# Patient Record
Sex: Female | Born: 1940
Health system: Southern US, Community
[De-identification: ages and names within clinical notes are randomized; demographics above are authoritative.]

## PROBLEM LIST (undated history)

## (undated) DIAGNOSIS — E785 Hyperlipidemia, unspecified: Secondary | ICD-10-CM

## (undated) DIAGNOSIS — I252 Old myocardial infarction: Secondary | ICD-10-CM

## (undated) DIAGNOSIS — I440 Atrioventricular block, first degree: Secondary | ICD-10-CM

## (undated) DIAGNOSIS — I10 Essential (primary) hypertension: Secondary | ICD-10-CM

## (undated) DIAGNOSIS — E119 Type 2 diabetes mellitus without complications: Secondary | ICD-10-CM

## (undated) DIAGNOSIS — Z90710 Acquired absence of both cervix and uterus: Secondary | ICD-10-CM

## (undated) DIAGNOSIS — Z7901 Long term (current) use of anticoagulants: Secondary | ICD-10-CM

## (undated) DIAGNOSIS — I2510 Atherosclerotic heart disease of native coronary artery without angina pectoris: Secondary | ICD-10-CM

## (undated) DIAGNOSIS — I517 Cardiomegaly: Secondary | ICD-10-CM

## (undated) DIAGNOSIS — D649 Anemia, unspecified: Secondary | ICD-10-CM

## (undated) DIAGNOSIS — Z79899 Other long term (current) drug therapy: Secondary | ICD-10-CM

## (undated) DIAGNOSIS — R51 Headache: Secondary | ICD-10-CM

## (undated) DIAGNOSIS — I4891 Unspecified atrial fibrillation: Secondary | ICD-10-CM

## (undated) DIAGNOSIS — E039 Hypothyroidism, unspecified: Secondary | ICD-10-CM

## (undated) DIAGNOSIS — I5181 Takotsubo syndrome: Secondary | ICD-10-CM

## (undated) DIAGNOSIS — I059 Rheumatic mitral valve disease, unspecified: Secondary | ICD-10-CM

## (undated) DIAGNOSIS — W19XXXA Unspecified fall, initial encounter: Secondary | ICD-10-CM

## (undated) DIAGNOSIS — R519 Headache, unspecified: Secondary | ICD-10-CM

## (undated) DIAGNOSIS — I214 Non-ST elevation (NSTEMI) myocardial infarction: Secondary | ICD-10-CM

## (undated) DIAGNOSIS — I079 Rheumatic tricuspid valve disease, unspecified: Secondary | ICD-10-CM

## (undated) HISTORY — DX: Hypothyroidism, unspecified: E03.9

## (undated) HISTORY — DX: Headache: R51

## (undated) HISTORY — PX: INCONTINENCE SURGERY: SHX676

## (undated) HISTORY — DX: Old myocardial infarction: I25.2

## (undated) HISTORY — DX: Other long term (current) drug therapy: Z79.899

## (undated) HISTORY — DX: Headache, unspecified: R51.9

## (undated) HISTORY — DX: Type 2 diabetes mellitus without complications: E11.9

## (undated) HISTORY — DX: Rheumatic mitral valve disease, unspecified: I05.9

## (undated) HISTORY — PX: TOTAL ABDOMINAL HYSTERECTOMY: SHX209

## (undated) HISTORY — DX: Non-ST elevation (NSTEMI) myocardial infarction: I21.4

## (undated) HISTORY — PX: NO PAST SURGERIES: SHX2092

## (undated) HISTORY — DX: Unspecified atrial fibrillation: I48.91

## (undated) HISTORY — DX: Unspecified fall, initial encounter: W19.XXXA

## (undated) HISTORY — DX: Cardiomegaly: I51.7

## (undated) HISTORY — DX: Essential (primary) hypertension: I10

## (undated) HISTORY — DX: Rheumatic tricuspid valve disease, unspecified: I07.9

## (undated) HISTORY — DX: Acquired absence of both cervix and uterus: Z90.710

## (undated) HISTORY — PX: APPENDECTOMY: SHX54

## (undated) HISTORY — DX: Atherosclerotic heart disease of native coronary artery without angina pectoris: I25.10

## (undated) HISTORY — DX: Anemia, unspecified: D64.9

## (undated) HISTORY — DX: Long term (current) use of anticoagulants: Z79.01

## (undated) HISTORY — DX: Hyperlipidemia, unspecified: E78.5

## (undated) HISTORY — DX: Atrioventricular block, first degree: I44.0

## (undated) HISTORY — DX: Takotsubo syndrome: I51.81

---

## 2005-03-28 HISTORY — PX: COLONOSCOPY: SHX174

## 2014-08-09 DIAGNOSIS — Z13820 Encounter for screening for osteoporosis: Secondary | ICD-10-CM | POA: Diagnosis not present

## 2014-08-09 DIAGNOSIS — M8589 Other specified disorders of bone density and structure, multiple sites: Secondary | ICD-10-CM | POA: Diagnosis not present

## 2014-08-09 DIAGNOSIS — M8580 Other specified disorders of bone density and structure, unspecified site: Secondary | ICD-10-CM | POA: Diagnosis not present

## 2014-12-10 ENCOUNTER — Encounter: Payer: Self-pay | Admitting: *Deleted

## 2014-12-14 NOTE — Progress Notes (Signed)
Patient ID: Melissa Jackson, female   DOB: 1940-08-13, 74 y.o.   MRN: [id number]     Cardiology Office Note   Date:  12/14/2014   ID:  Melissa Jackson, DOB Oct 12, 1940, MRN [id number]  PCP:  No primary care provider on file.  Cardiologist:   Jenkins Rouge, MD   No chief complaint on file.     History of Present Illness: Melissa Jackson How is a 74 y.o. female who presents for evaluation of CAD.  Has seen Dr [name] in [facility] before I take care of her husband  Reviewed over 58 pages of notes from [name] primary in [place] and Dr [name] [facility] previous cardiologist.  She is intolerant to statins Tried on multiple and all caused muscle wasting and pain.  9/232014 had classic angina and had complex intervention with Xience stent of LAD and Ramus and POB of D1  EF preserved 55%  No recurrent angina.  Carries nitro with her.  No stress testing since  Also has PAF.  With SSS.  Amiodarone has been effective.  Primary checks TSH and liver have been normal.  CXR last year 9315 reviewed normal but no PFTls Noted to be bradycardic on current dose of beta blocker.    Tries to watch diet with DM    LDL runs 170-185 on red yeast rice HDL in 50's   Past Medical History  Diagnosis Date  . Acute myocardial infarction, subendocardial infarction, initial episode of care   . Anemia   . Anticoagulated     ON PRADAXA  . Atrial fibrillation   . Coronary artery disease   . Diabetes mellitus with no complication   . Disease of tricuspid valve     MILD  . Hypertension, essential   . Fall   . First degree atrioventricular block   . Head ache   . High risk medication use   . Hyperlipidemia     SEVERE  . Hypothyroidism   . Left atrial enlargement     MILD  . Mitral valvular disorder     MILD-MODERATE  . Old myocardial infarction     JAN 2008  . Takotsubo syndrome     No past surgical history on file.   Current Outpatient Prescriptions  Medication Sig Dispense Refill  . amiodarone (PACERONE) 400 MG tablet  Take 200 mg by mouth daily.    Marland Kitchen aspirin 81 MG tablet Take 81 mg by mouth daily.    . clopidogrel (PLAVIX) 75 MG tablet Take 75 mg by mouth daily.    Marland Kitchen glyBURIDE-metformin (GLUCOVANCE) 5-500 MG per tablet Take 2 tablets by mouth 2 (two) times daily with a meal.    . levothyroxine (SYNTHROID, LEVOTHROID) 75 MCG tablet Take 75 mcg by mouth daily before breakfast.    . metoprolol (LOPRESSOR) 50 MG tablet Take 50 mg by mouth 2 (two) times daily.    . Multiple Vitamins-Minerals (MULTIVITAMIN WITH MINERALS) tablet Take 1 tablet by mouth daily.    . nitroGLYCERIN (NITROSTAT) 0.4 MG SL tablet Place 0.4 mg under the tongue every 5 (five) minutes as needed for chest pain.    . Omega 3 1000 MG CAPS Take 2 capsules by mouth 2 (two) times daily.    . Red Yeast Rice 600 MG CAPS Take by mouth.     No current facility-administered medications for this visit.    Allergies:   Lisinopril; Codeine; Morphine and related; and Statins    Social History:  The patient  reports that she has never  smoked. She does not have any smokeless tobacco history on file.   Family History:  The patient's family history includes Alzheimer's disease in her mother; Diabetes Mellitus I in her mother; Heart attack in her father.    ROS:  Please see the history of present illness.   Otherwise, review of systems are positive for none.   All other systems are reviewed and negative.    PHYSICAL EXAM: VS:  There were no vitals taken for this visit. , BMI There is no height or weight on file to calculate BMI. Affect appropriate Healthy:  appears stated age 48: normal Neck supple with no adenopathy JVP normal no bruits no thyromegaly Lungs clear with no wheezing and good diaphragmatic motion Heart:  S1/S2 no murmur, no rub, gallop or click PMI normal Abdomen: benighn, BS positve, no tenderness, no AAA no bruit.  No HSM or HJR Distal pulses intact with no bruits No edema Neuro non-focal Skin warm and dry No muscular  weakness    EKG:  NSR rate 51 read as junctional but not inferior ST changes no change from [place] ECG 9/15    Recent Labs: No results found for requested labs within last 365 days.    Lipid Panel No results found for: CHOL, TRIG, HDL, CHOLHDL, VLDL, LDLCALC, LDLDIRECT    Wt Readings from Last 3 Encounters:  No data found for Wt      Other studies Reviewed: Additional studies/ records that were reviewed today include: Records from [place], [place], [place] and [place] .    ASSESSMENT AND PLAN:  1.  CAD:  Given complexity of intervention continue DAT.  No angina  Has nitro continue beta blocker 2. PAF:  Continue low dose amiodarone.  TSH/LFTls ok order PFT;s with DLCO 3. SSS:  Decrease lopressor to 25 bid and f/u no syncopal symptoms or HB 4. Chol:  Discussed praluant.  Refer to lipid clinic I strongly encouraged her to pursue    Current medicines are reviewed at length with the patient today.  The patient does not have concerns regarding medicines.  The following changes have been made:  Decrease lopressor to 25 bid  Labs/ tests ordered today include: PFTls DLCO  Refer to lipid clinic   No orders of the defined types were placed in this encounter.     Disposition:   FU with next available     Signed, Jenkins Rouge, MD  12/14/2014 11:08 AM    [facility] [address], [place], [place]  [address] Phone: [phone]; Fax: [phone]

## 2014-12-15 ENCOUNTER — Ambulatory Visit (INDEPENDENT_AMBULATORY_CARE_PROVIDER_SITE_OTHER): Payer: Medicare Other | Admitting: Cardiovascular Disease

## 2014-12-15 ENCOUNTER — Encounter: Payer: Self-pay | Admitting: Cardiovascular Disease

## 2014-12-15 VITALS — BP 130/80 | HR 51 | Ht 64.0 in | Wt 117.4 lb

## 2014-12-15 DIAGNOSIS — Z79899 Other long term (current) drug therapy: Secondary | ICD-10-CM

## 2014-12-15 MED ORDER — METOPROLOL TARTRATE 25 MG PO TABS: 50.0000 mg | ORAL_TABLET | Freq: Two times a day (BID) | ORAL | Status: DC

## 2014-12-15 NOTE — Patient Instructions (Addendum)
Medication Instructions:   DECREASE METOPROLOL TO  25 MG  TWICE  DAILY  Labwork: LIPID CLINIC  WITH SALLY  TO  START  PRALUENT  Testing/Procedures:  Your physician has recommended that you have a pulmonary function test. Pulmonary Function Tests are a group of tests that measure how well air moves in and out of your lungs.   Follow-Up: Your physician wants you to follow-up in:   [place] will receive a reminder letter in the mail two months in advance. If you don't receive a letter, please call our office to schedule the follow-up appointment.   Any Other Special Instructions Will Be Listed Below (If Applicable).

## 2015-01-03 ENCOUNTER — Ambulatory Visit (INDEPENDENT_AMBULATORY_CARE_PROVIDER_SITE_OTHER): Payer: Medicare Other | Admitting: Internal Medicine

## 2015-01-03 DIAGNOSIS — Z79899 Other long term (current) drug therapy: Secondary | ICD-10-CM | POA: Diagnosis not present

## 2015-01-03 LAB — PULMONARY FUNCTION TEST
DL/VA % PRED: 79 %
DL/VA: 3.81 ml/min/mmHg/L
DLCO unc % pred: 75 %
DLCO unc: 18.26 ml/min/mmHg
FEF 25-75 PRE: 1.89 L/s
FEF 25-75 Post: 2.02 L/sec
FEF2575-%Change-Post: 6 %
FEF2575-%Pred-Post: 127 %
FEF2575-%Pred-Pre: 119 %
FEV1-%Change-Post: 1 %
FEV1-%PRED-POST: 125 %
FEV1-%Pred-Pre: 122 %
FEV1-POST: 2.58 L
FEV1-Pre: 2.53 L
FEV1FVC-%Change-Post: 7 %
FEV1FVC-%Pred-Pre: 98 %
FEV6-%Change-Post: -5 %
FEV6-%Pred-Post: 123 %
FEV6-%Pred-Pre: 130 %
FEV6-PRE: 3.39 L
FEV6-Post: 3.22 L
FEV6FVC-%Change-Post: 0 %
FEV6FVC-%PRED-POST: 105 %
FEV6FVC-%PRED-PRE: 104 %
FVC-%CHANGE-POST: -5 %
FVC-%Pred-Post: 118 %
FVC-%Pred-Pre: 124 %
FVC-PRE: 3.42 L
FVC-Post: 3.25 L
PRE FEV1/FVC RATIO: 74 %
Post FEV1/FVC ratio: 79 %
Post FEV6/FVC ratio: 100 %
Pre FEV6/FVC Ratio: 99 %

## 2015-01-03 NOTE — Progress Notes (Signed)
PFT done today. 

## 2015-01-04 ENCOUNTER — Telehealth: Payer: Self-pay | Admitting: Cardiovascular Disease

## 2015-01-04 NOTE — Telephone Encounter (Signed)
PT  AWARE OF PFT  RESULTS .Melissa Jackson

## 2015-01-04 NOTE — Telephone Encounter (Signed)
New message    Patient husband returning call back to nurse

## 2015-01-06 DIAGNOSIS — S8002XA Contusion of left knee, initial encounter: Secondary | ICD-10-CM | POA: Diagnosis not present

## 2015-01-06 DIAGNOSIS — S9032XA Contusion of left foot, initial encounter: Secondary | ICD-10-CM | POA: Diagnosis not present

## 2015-03-03 DIAGNOSIS — Z1389 Encounter for screening for other disorder: Secondary | ICD-10-CM | POA: Diagnosis not present

## 2015-03-03 DIAGNOSIS — Z139 Encounter for screening, unspecified: Secondary | ICD-10-CM | POA: Diagnosis not present

## 2015-03-03 DIAGNOSIS — E039 Hypothyroidism, unspecified: Secondary | ICD-10-CM | POA: Diagnosis not present

## 2015-03-03 DIAGNOSIS — E1129 Type 2 diabetes mellitus with other diabetic kidney complication: Secondary | ICD-10-CM | POA: Diagnosis not present

## 2015-03-03 DIAGNOSIS — Z23 Encounter for immunization: Secondary | ICD-10-CM | POA: Diagnosis not present

## 2015-03-03 DIAGNOSIS — Z79899 Other long term (current) drug therapy: Secondary | ICD-10-CM | POA: Diagnosis not present

## 2015-03-03 DIAGNOSIS — E785 Hyperlipidemia, unspecified: Secondary | ICD-10-CM | POA: Diagnosis not present

## 2015-03-03 DIAGNOSIS — Z9181 History of falling: Secondary | ICD-10-CM | POA: Diagnosis not present

## 2015-03-03 DIAGNOSIS — I2510 Atherosclerotic heart disease of native coronary artery without angina pectoris: Secondary | ICD-10-CM | POA: Diagnosis not present

## 2015-03-03 DIAGNOSIS — I10 Essential (primary) hypertension: Secondary | ICD-10-CM | POA: Diagnosis not present

## 2015-03-18 DIAGNOSIS — Z1231 Encounter for screening mammogram for malignant neoplasm of breast: Secondary | ICD-10-CM | POA: Diagnosis not present

## 2015-03-22 DIAGNOSIS — E119 Type 2 diabetes mellitus without complications: Secondary | ICD-10-CM | POA: Diagnosis not present

## 2015-06-15 NOTE — Progress Notes (Signed)
Patient ID: Melissa Jackson, female   DOB: [birth year], 75 y.o.   MRN: [id number]     Cardiology Office Note   Date:  06/16/2015   ID:  Melissa Jackson, DOB [birth year], MRN [id number]  Melissa:  Melissa Jackson  Cardiologist:   Melissa Jackson   Chief Complaint  Patient presents with  . Follow-up    long term med use      History of Present Illness: Melissa Jackson is a 75 y.o. female who presents for evaluation of CAD.  Has seen Melissa Jackson in [facility] before I take care of her husband  Reviewed over 91 pages of notes from Melissa Jackson primary in [place] and Melissa Jackson previous cardiologist.  She is intolerant to statins Tried on multiple and all caused muscle wasting and pain.  9/232014 had classic angina and had complex intervention with Xience stent of LAD and Ramus and POB of D1  EF preserved 55%  No recurrent angina.  Carries nitro with her.  No stress testing since  Also has PAF.  With SSS.  Amiodarone has been effective.  Primary checks TSH and liver have been normal.  CXR last year 9315 reviewed normal   Noted to be bradycardic on current dose of beta blocker.  Decreased last visit to 25 bid   Tries to watch diet with DM    LDL runs 170-185 on red yeast rice HDL in 50's  Not really taking red yeast rice daily and intolerant to statins  Diabetic not on ACE ? Cough with lisinopril in past  BP high in office today   Past Medical History  Diagnosis Date  . Acute myocardial infarction, subendocardial infarction, initial episode of care ([place])   . Anemia   . Anticoagulated     ON PRADAXA  . Atrial fibrillation ([place])   . Coronary artery disease   . Diabetes mellitus with no complication ([place])   . Disease of tricuspid valve     MILD  . Hypertension, essential   . Fall   . First degree atrioventricular block   . Head ache   . High risk medication use   . Hyperlipidemia     SEVERE  . Hypothyroidism   . Left atrial enlargement     MILD  . Mitral valvular disorder     MILD-MODERATE  .  Old myocardial infarction     JAN 2008  . Takotsubo syndrome     History reviewed. No pertinent past surgical history.   Current Outpatient Prescriptions  Medication Sig Dispense Refill  . alendronate (FOSAMAX) 70 MG tablet Take 70 mg by mouth once a week.    Marland Kitchen amiodarone (PACERONE) 200 MG tablet Take 200 mg by mouth daily.    Marland Kitchen amLODipine (NORVASC) 5 MG tablet Take 5 mg by mouth daily.    Marland Kitchen aspirin 81 MG tablet Take 81 mg by mouth daily.    . chlorthalidone (HYGROTON) 25 MG tablet Take 25 mg by mouth every morning.    . clopidogrel (PLAVIX) 75 MG tablet Take 75 mg by mouth daily.    Marland Kitchen glyBURIDE-metformin (GLUCOVANCE) 5-500 MG per tablet Take 2 tablets by mouth 2 (two) times daily with a meal.    . levothyroxine (SYNTHROID, LEVOTHROID) 75 MCG tablet Take 75 mcg by mouth daily before breakfast.    . metoprolol (LOPRESSOR) 25 MG tablet Take 2 tablets (50 mg total) by mouth 2 (two) times daily. 60 tablet 11  . Multiple Vitamins-Minerals (MULTIVITAMIN WITH MINERALS) tablet  Take 1 tablet by mouth daily.    . nitroGLYCERIN (NITROSTAT) 0.4 MG SL tablet Place 0.4 mg under the tongue every 5 (five) minutes as needed for chest pain.    . Omega 3 1000 MG CAPS Take 2 capsules by mouth 2 (two) times daily.    . Red Yeast Rice 600 MG CAPS Take 1 capsule by mouth daily.      No current facility-administered medications for this visit.    Allergies:   Lisinopril; Codeine; Morphine and related; and Statins    Social History:  The patient  reports that she has never smoked. She does not have any smokeless tobacco history on file. She reports that she does not drink alcohol or use illicit drugs.   Family History:  The patient's family history includes Alzheimer's disease in her mother; Diabetes Mellitus I in her mother; Heart attack in her father.    ROS:  Please see the history of present illness.   Otherwise, review of systems are positive for none.   All other systems are reviewed and negative.     PHYSICAL EXAM: VS:  BP 170/64 mmHg  Pulse 48  Ht 5\' 4"  (1.626 m)  Wt 52.164 kg (115 lb)  BMI 19.73 kg/m2  SpO2 98% , BMI Body mass index is 19.73 kg/(m^2). Affect appropriate Healthy:  appears stated age 63: normal Neck supple with no adenopathy JVP normal no bruits no thyromegaly Lungs clear with no wheezing and good diaphragmatic motion Heart:  S1/S2 no murmur, no rub, gallop or click PMI normal Abdomen: benighn, BS positve, no tenderness, no AAA no bruit.  No HSM or HJR Distal pulses intact with no bruits No edema Neuro non-focal Skin warm and dry No muscular weakness    EKG:  NSR rate 51 read as junctional but not inferior ST changes no change from [place] ECG 9/15    Recent Labs: No results found for requested labs within last 365 days.    Lipid Panel No results found for: CHOL, TRIG, HDL, CHOLHDL, VLDL, LDLCALC, LDLDIRECT    Wt Readings from Last 3 Encounters:  06/16/15 52.164 kg (115 lb)  12/15/14 53.252 kg (117 lb 6.4 oz)      Other studies Reviewed: Additional studies/ records that were reviewed today include: Records from [place], [place], [place] and [place] .    ASSESSMENT AND PLAN:  1.  CAD:  Given complexity of intervention continue DAT.  No angina  Has nitro continue beta blocker 2. PAF:  Continue low dose amiodarone.  TSH/LFTls ok   DLCO 75% predicted August 2016 needs f/u 01/2016 sees Melissa Annamaria Boots  3. SSS:  Decrease lopressor to 25 bid and f/u no syncopal symptoms or HB 4. Chol:  Discussed taking 1200 mg red yeast rice daly 5. HTN:  With DM and significant CAD start cozaar 25 mg f/u 3 months    Current medicines are reviewed at length with the patient today.  The patient does not have concerns regarding medicines.  The following changes have been made:  Decrease lopressor to 25 bid  Started Cozaar 25 mg    No orders of the defined types were placed in this encounter.     Disposition:   FU with me 3 months     Signed, Melissa Rouge,  Jackson  06/16/2015 2:43 PM    [facility] [address], [place], [place]  [address] Phone: [phone]; Fax: [phone]

## 2015-06-16 ENCOUNTER — Ambulatory Visit (INDEPENDENT_AMBULATORY_CARE_PROVIDER_SITE_OTHER): Payer: Medicare Other | Admitting: Cardiovascular Disease

## 2015-06-16 ENCOUNTER — Encounter: Payer: Self-pay | Admitting: Cardiovascular Disease

## 2015-06-16 VITALS — BP 170/64 | HR 48 | Ht 64.0 in | Wt 115.0 lb

## 2015-06-16 DIAGNOSIS — Z79899 Other long term (current) drug therapy: Secondary | ICD-10-CM | POA: Diagnosis not present

## 2015-06-16 MED ORDER — LOSARTAN POTASSIUM 25 MG PO TABS: 25.0000 mg | ORAL_TABLET | Freq: Every day | ORAL | Status: DC

## 2015-06-16 MED ORDER — METOPROLOL TARTRATE 50 MG PO TABS: 50.0000 mg | ORAL_TABLET | Freq: Two times a day (BID) | ORAL | Status: DC

## 2015-06-16 NOTE — Patient Instructions (Addendum)
Medication Instructions:  Your physician has recommended you make the following change in your medication:  1- Cozaar 25 mg by mouth daily.   Labwork: NONE  Testing/Procedures: NONE  Follow-Up: Your physician wants you to follow-up in: 3 months with Dr. Johnsie Cancel.   If you need a refill on your cardiac medications before your next appointment, please call your pharmacy.

## 2015-09-02 ENCOUNTER — Encounter: Payer: Self-pay | Admitting: Cardiovascular Disease

## 2015-09-02 DIAGNOSIS — E785 Hyperlipidemia, unspecified: Secondary | ICD-10-CM | POA: Diagnosis not present

## 2015-09-02 DIAGNOSIS — Z79899 Other long term (current) drug therapy: Secondary | ICD-10-CM | POA: Diagnosis not present

## 2015-09-02 DIAGNOSIS — Z8679 Personal history of other diseases of the circulatory system: Secondary | ICD-10-CM | POA: Diagnosis not present

## 2015-09-02 DIAGNOSIS — E1129 Type 2 diabetes mellitus with other diabetic kidney complication: Secondary | ICD-10-CM | POA: Diagnosis not present

## 2015-09-02 DIAGNOSIS — I10 Essential (primary) hypertension: Secondary | ICD-10-CM | POA: Diagnosis not present

## 2015-09-02 DIAGNOSIS — E039 Hypothyroidism, unspecified: Secondary | ICD-10-CM | POA: Diagnosis not present

## 2015-09-02 DIAGNOSIS — I2510 Atherosclerotic heart disease of native coronary artery without angina pectoris: Secondary | ICD-10-CM | POA: Diagnosis not present

## 2015-09-26 NOTE — Progress Notes (Signed)
Patient ID: Melissa Jackson, female   DOB: 12-30-1940, 75 y.o.   MRN: [id number]     Cardiology Office Note   Date:  09/26/2015   ID:  Melissa Jackson, DOB 1941/01/14, MRN [id number]  PCP:  Pcp Not In System  Cardiologist:   Jenkins Rouge, MD   No chief complaint on file.     History of Present Illness: Melissa Jackson is a 75 y.o. female who presents for evaluation of CAD.  Has seen Dr [name] in [facility] before I take care of her husband  Reviewed over 79 pages of notes from [name] primary in [place] and Dr [name] [facility] previous cardiologist.  She is intolerant to statins Tried on multiple and all caused muscle wasting and pain.  9/232014 had classic angina and had complex intervention with Xience stent of LAD and Ramus and POB of D1  EF preserved 55%  No recurrent angina.  Carries nitro with her.  No stress testing since  Also has PAF.  With SSS.  Amiodarone has been effective.  Primary checks TSH and liver have been normal.  CXR last year 9315 reviewed normal   Noted to be bradycardic on current dose of beta blocker.  Decreased last visit to 25 bid   Tries to watch diet with DM    LDL runs 170-185 on red yeast rice HDL in 50's  Not really taking red yeast rice daily and intolerant to statins  Diabetic not on ACE ? Cough with lisinopril in past  BP high in office today   06/2015 Beta blocker decreased for SSS and ARB added for BP  Past Medical History  Diagnosis Date  . Acute myocardial infarction, subendocardial infarction, initial episode of care ([place])   . Anemia   . Anticoagulated     ON PRADAXA  . Atrial fibrillation ([place])   . Coronary artery disease   . Diabetes mellitus with no complication ([place])   . Disease of tricuspid valve     MILD  . Hypertension, essential   . Fall   . First degree atrioventricular block   . Head ache   . High risk medication use   . Hyperlipidemia     SEVERE  . Hypothyroidism   . Left atrial enlargement     MILD  . Mitral valvular disorder    MILD-MODERATE  . Old myocardial infarction     JAN 2008  . Takotsubo syndrome     Past Surgical History  Procedure Laterality Date  . No past surgeries       Current Outpatient Prescriptions  Medication Sig Dispense Refill  . alendronate (FOSAMAX) 70 MG tablet Take 70 mg by mouth once a week.    Marland Kitchen amiodarone (PACERONE) 200 MG tablet Take 200 mg by mouth daily.    Marland Kitchen amLODipine (NORVASC) 5 MG tablet Take 5 mg by mouth daily.    Marland Kitchen aspirin 81 MG tablet Take 81 mg by mouth daily.    . chlorthalidone (HYGROTON) 25 MG tablet Take 25 mg by mouth every morning.    . clopidogrel (PLAVIX) 75 MG tablet Take 75 mg by mouth daily.    Marland Kitchen glyBURIDE-metformin (GLUCOVANCE) 5-500 MG per tablet Take 2 tablets by mouth 2 (two) times daily with a meal.    . levothyroxine (SYNTHROID, LEVOTHROID) 75 MCG tablet Take 75 mcg by mouth daily before breakfast.    . losartan (COZAAR) 25 MG tablet Take 1 tablet (25 mg total) by mouth daily. 90 tablet 3  . metoprolol tartrate (  LOPRESSOR) 50 MG tablet Take 1 tablet (50 mg total) by mouth 2 (two) times daily. 180 tablet 3  . Multiple Vitamins-Minerals (MULTIVITAMIN WITH MINERALS) tablet Take 1 tablet by mouth daily.    . nitroGLYCERIN (NITROSTAT) 0.4 MG SL tablet Place 0.4 mg under the tongue every 5 (five) minutes as needed for chest pain.    . Omega 3 1000 MG CAPS Take 2 capsules by mouth 2 (two) times daily.    . Red Yeast Rice 600 MG CAPS Take 1 capsule by mouth daily.      No current facility-administered medications for this visit.    Allergies:   Lisinopril; Codeine; Morphine and related; and Statins    Social History:  The patient  reports that she has never smoked. She does not have any smokeless tobacco history on file. She reports that she does not drink alcohol or use illicit drugs.   Family History:  The patient's family history includes Alzheimer's disease in her mother; Diabetes Mellitus I in her mother; Heart attack in her father.    ROS:  Please  see the history of present illness.   Otherwise, review of systems are positive for none.   All other systems are reviewed and negative.    PHYSICAL EXAM: VS:  There were no vitals taken for this visit. , BMI There is no weight on file to calculate BMI. Affect appropriate Healthy:  appears stated age 75: normal Neck supple with no adenopathy JVP normal no bruits no thyromegaly Lungs clear with no wheezing and good diaphragmatic motion Heart:  S1/S2 no murmur, no rub, gallop or click PMI normal Abdomen: benighn, BS positve, no tenderness, no AAA no bruit.  No HSM or HJR Distal pulses intact with no bruits No edema Neuro non-focal Skin warm and dry No muscular weakness    EKG:  NSR rate 51 read as junctional but not inferior ST changes no change from [place] ECG 9/15  09/27/15  SR rate 52 inferolateral T wave changes    Recent Labs: No results found for requested labs within last 365 days.    Lipid Panel No results found for: CHOL, TRIG, HDL, CHOLHDL, VLDL, LDLCALC, LDLDIRECT    Wt Readings from Last 3 Encounters:  06/16/15 52.164 kg (115 lb)  12/15/14 53.252 kg (117 lb 6.4 oz)      Other studies Reviewed: Additional studies/ records that were reviewed today include: Records from [place], [place], [place] and [place] .    ASSESSMENT AND PLAN:  1.  CAD:  Given complexity of intervention continue DAT.  No angina  Has nitro continue beta blocker 2. PAF:  Continue low dose amiodarone.  TSH/LFTls ok   DLCO 75% predicted August 2016 needs f/u 01/2016 sees Dr Annamaria Boots  3. SSS:  Better on lower dose lopressor to 25 bid and f/u no syncopal symptoms or HB 4. Chol:  Discussed taking 1200 mg red yeast rice daly 5. HTN:  With DM and significant CAD on ARB tolerating well    Current medicines are reviewed at length with the patient today.  The patient does not have concerns regarding medicines.    Signed, Jenkins Rouge, MD  09/26/2015 2:57 PM    [facility] [address], [place], [place]  [address] Phone: [phone]; Fax: [phone]

## 2015-09-27 ENCOUNTER — Ambulatory Visit (INDEPENDENT_AMBULATORY_CARE_PROVIDER_SITE_OTHER): Payer: Medicare Other | Admitting: Cardiovascular Disease

## 2015-09-27 ENCOUNTER — Encounter: Payer: Self-pay | Admitting: Cardiovascular Disease

## 2015-09-27 VITALS — BP 142/60 | HR 53 | Ht 64.0 in | Wt 115.6 lb

## 2015-09-27 DIAGNOSIS — Z9189 Other specified personal risk factors, not elsewhere classified: Secondary | ICD-10-CM

## 2015-09-27 DIAGNOSIS — Z79899 Other long term (current) drug therapy: Secondary | ICD-10-CM

## 2015-09-27 NOTE — Patient Instructions (Signed)

## 2015-09-27 NOTE — Addendum Note (Signed)
Addended by: Aris Georgia, Fiore Detjen L on: 09/27/2015 03:12 PM   Modules accepted: Orders

## 2016-01-03 ENCOUNTER — Encounter (INDEPENDENT_AMBULATORY_CARE_PROVIDER_SITE_OTHER): Payer: Medicare Other | Admitting: Internal Medicine

## 2016-01-03 DIAGNOSIS — Z79899 Other long term (current) drug therapy: Secondary | ICD-10-CM | POA: Diagnosis not present

## 2016-01-03 DIAGNOSIS — Z9189 Other specified personal risk factors, not elsewhere classified: Secondary | ICD-10-CM

## 2016-01-03 LAB — PULMONARY FUNCTION TEST
DL/VA % pred: 82 %
DL/VA: 3.98 ml/min/mmHg/L
DLCO UNC: 17.1 ml/min/mmHg
DLCO cor % pred: 71 %
DLCO cor: 17.43 ml/min/mmHg
DLCO unc % pred: 70 %
FEF 25-75 POST: 2.19 L/s
FEF 25-75 Pre: 1.84 L/sec
FEF2575-%CHANGE-POST: 19 %
FEF2575-%PRED-POST: 143 %
FEF2575-%Pred-Pre: 119 %
FEV1-%CHANGE-POST: 4 %
FEV1-%PRED-POST: 126 %
FEV1-%Pred-Pre: 120 %
FEV1-POST: 2.57 L
FEV1-PRE: 2.45 L
FEV1FVC-%Change-Post: 5 %
FEV1FVC-%PRED-PRE: 99 %
FEV6-%Change-Post: -1 %
FEV6-%Pred-Post: 126 %
FEV6-%Pred-Pre: 128 %
FEV6-POST: 3.26 L
FEV6-Pre: 3.3 L
FEV6FVC-%Change-Post: 0 %
FEV6FVC-%Pred-Post: 104 %
FEV6FVC-%Pred-Pre: 104 %
FVC-%CHANGE-POST: 0 %
FVC-%PRED-PRE: 122 %
FVC-%Pred-Post: 121 %
FVC-POST: 3.29 L
FVC-PRE: 3.32 L
POST FEV1/FVC RATIO: 78 %
PRE FEV6/FVC RATIO: 100 %
Post FEV6/FVC ratio: 99 %
Pre FEV1/FVC ratio: 74 %

## 2016-01-09 DIAGNOSIS — Z6820 Body mass index (BMI) 20.0-20.9, adult: Secondary | ICD-10-CM | POA: Diagnosis not present

## 2016-01-09 DIAGNOSIS — E11649 Type 2 diabetes mellitus with hypoglycemia without coma: Secondary | ICD-10-CM | POA: Diagnosis not present

## 2016-01-09 DIAGNOSIS — E1129 Type 2 diabetes mellitus with other diabetic kidney complication: Secondary | ICD-10-CM | POA: Diagnosis not present

## 2016-03-08 ENCOUNTER — Encounter: Payer: Self-pay | Admitting: Cardiovascular Disease

## 2016-03-08 DIAGNOSIS — E039 Hypothyroidism, unspecified: Secondary | ICD-10-CM | POA: Diagnosis not present

## 2016-03-08 DIAGNOSIS — Z1389 Encounter for screening for other disorder: Secondary | ICD-10-CM | POA: Diagnosis not present

## 2016-03-08 DIAGNOSIS — E1129 Type 2 diabetes mellitus with other diabetic kidney complication: Secondary | ICD-10-CM | POA: Diagnosis not present

## 2016-03-08 DIAGNOSIS — Z139 Encounter for screening, unspecified: Secondary | ICD-10-CM | POA: Diagnosis not present

## 2016-03-08 DIAGNOSIS — E785 Hyperlipidemia, unspecified: Secondary | ICD-10-CM | POA: Diagnosis not present

## 2016-03-08 DIAGNOSIS — Z79899 Other long term (current) drug therapy: Secondary | ICD-10-CM | POA: Diagnosis not present

## 2016-03-08 DIAGNOSIS — I2510 Atherosclerotic heart disease of native coronary artery without angina pectoris: Secondary | ICD-10-CM | POA: Diagnosis not present

## 2016-03-08 DIAGNOSIS — Z9181 History of falling: Secondary | ICD-10-CM | POA: Diagnosis not present

## 2016-03-08 DIAGNOSIS — Z23 Encounter for immunization: Secondary | ICD-10-CM | POA: Diagnosis not present

## 2016-03-08 DIAGNOSIS — I10 Essential (primary) hypertension: Secondary | ICD-10-CM | POA: Diagnosis not present

## 2016-03-21 DIAGNOSIS — Z1231 Encounter for screening mammogram for malignant neoplasm of breast: Secondary | ICD-10-CM | POA: Diagnosis not present

## 2016-03-22 DIAGNOSIS — E119 Type 2 diabetes mellitus without complications: Secondary | ICD-10-CM | POA: Diagnosis not present

## 2016-04-10 DIAGNOSIS — E039 Hypothyroidism, unspecified: Secondary | ICD-10-CM | POA: Diagnosis not present

## 2016-04-12 NOTE — Progress Notes (Signed)
Patient ID: Melissa Jackson, female   DOB: 1940/12/12, 74 y.o.   MRN: [id number]     Cardiology Office Note   Date:  04/13/2016   ID:  Melissa Jackson, DOB Jan 14, 1941, MRN [id number]  PCP:  Pcp Not In System  Cardiologist:   Jenkins Rouge, MD   Chief Complaint  Patient presents with  . At risk for amiodarone toxicity with long term use      History of Present Illness: Melissa Jackson is a 75 y.o. female who presents for evaluation of CAD.  Has seen Dr [name] in [facility] before I take care of her husband  Reviewed over 55 pages of notes from [name] primary in [place] and Dr [name] [facility] previous cardiologist.  She is intolerant to statins Tried on multiple and all caused muscle wasting and pain.  9/232014 had classic angina and had complex intervention with Xience stent of LAD and Ramus and POB of D1  EF preserved 55%  No recurrent angina.  Carries nitro with her.  No stress testing since  Also has PAF.  With SSS.  Amiodarone has been effective.  Primary checks TSH and liver have been normal.  CXR last year 9315 reviewed normal   Noted to be bradycardic on current dose of beta blocker.  Decreased last visit to 25 bid   Tries to watch diet with DM    LDL runs 170-185 on red yeast rice HDL in 50's  Not really taking red yeast rice daily and intolerant to statins  Diabetic not on ACE ? Cough with lisinopril in past  BP high in office today   06/2015 Beta blocker decreased for SSS and ARB added for BP  Past Medical History:  Diagnosis Date  . Acute myocardial infarction, subendocardial infarction, initial episode of care ([place])   . Anemia   . Anticoagulated    ON PRADAXA  . Atrial fibrillation ([place])   . Coronary artery disease   . Diabetes mellitus with no complication ([place])   . Disease of tricuspid valve    MILD  . Fall   . First degree atrioventricular block   . Head ache   . High risk medication use   . Hyperlipidemia    SEVERE  . Hypertension, essential   . Hypothyroidism   . Left  atrial enlargement    MILD  . Mitral valvular disorder    MILD-MODERATE  . Old myocardial infarction    JAN 2008  . Takotsubo syndrome     Past Surgical History:  Procedure Laterality Date  . NO PAST SURGERIES       Current Outpatient Prescriptions  Medication Sig Dispense Refill  . alendronate (FOSAMAX) 70 MG tablet Take 70 mg by mouth once a week.    Marland Kitchen amiodarone (PACERONE) 200 MG tablet Take 200 mg by mouth daily.    Marland Kitchen amLODipine (NORVASC) 5 MG tablet Take 5 mg by mouth daily.    Marland Kitchen aspirin 81 MG tablet Take 81 mg by mouth daily.    . chlorthalidone (HYGROTON) 25 MG tablet Take 25 mg by mouth every morning.    . clopidogrel (PLAVIX) 75 MG tablet Take 75 mg by mouth daily.    Marland Kitchen glipiZIDE-metformin (METAGLIP) 5-500 MG tablet Take 1 tablet by mouth 2 (two) times daily.    Marland Kitchen levothyroxine (SYNTHROID, LEVOTHROID) 100 MCG tablet Take 100 mcg by mouth daily.    Marland Kitchen losartan (COZAAR) 25 MG tablet Take 1 tablet (25 mg total) by mouth daily. 90 tablet 3  .  metoprolol tartrate (LOPRESSOR) 50 MG tablet Take 1 tablet (50 mg total) by mouth 2 (two) times daily. 180 tablet 3  . Multiple Vitamins-Minerals (MULTIVITAMIN WITH MINERALS) tablet Take 1 tablet by mouth daily.    . nitroGLYCERIN (NITROSTAT) 0.4 MG SL tablet Place 0.4 mg under the tongue every 5 (five) minutes as needed for chest pain.    . Omega 3 1000 MG CAPS Take 2 capsules by mouth 2 (two) times daily.    . Red Yeast Rice 600 MG CAPS Take 1 capsule by mouth daily.      No current facility-administered medications for this visit.     Allergies:   Lisinopril; Codeine; Morphine and related; and Statins    Social History:  The patient  reports that she has never smoked. She has never used smokeless tobacco. She reports that she does not drink alcohol or use drugs.   Family History:  The patient's family history includes Alzheimer's disease in her mother; Diabetes Mellitus I in her mother; Heart attack in her father.    ROS:   Please see the history of present illness.   Otherwise, review of systems are positive for none.   All other systems are reviewed and negative.    PHYSICAL EXAM: VS:  BP (!) 172/70   Pulse (!) 51   Ht 5\' 4"  (1.626 m)   Wt 54.9 kg (121 lb)   SpO2 99%   BMI 20.77 kg/m  , BMI Body mass index is 20.77 kg/m. Affect appropriate Healthy:  appears stated age 53: normal Neck supple with no adenopathy JVP normal no bruits no thyromegaly Lungs clear with no wheezing and good diaphragmatic motion Heart:  S1/S2 no murmur, no rub, gallop or click PMI normal Abdomen: benighn, BS positve, no tenderness, no AAA no bruit.  No HSM or HJR Distal pulses intact with no bruits No edema Neuro non-focal Skin warm and dry No muscular weakness    EKG:  NSR rate 51 read as junctional but not inferior ST changes no change from [place] ECG 9/15  09/27/15  SR rate 52 inferolateral T wave changes    Recent Labs: No results found for requested labs within last 8760 hours.    Lipid Panel No results found for: CHOL, TRIG, HDL, CHOLHDL, VLDL, LDLCALC, LDLDIRECT    Wt Readings from Last 3 Encounters:  04/13/16 54.9 kg (121 lb)  09/27/15 52.4 kg (115 lb 9.6 oz)  06/16/15 52.2 kg (115 lb)      Other studies Reviewed: Additional studies/ records that were reviewed today include: Records from [place], [place], [place] and [place] .    ASSESSMENT AND PLAN:  1.  CAD:  Given complexity of intervention continue DAT.  No angina  Has nitro continue beta blocker 2. PAF:  Continue low dose amiodarone. DLCO only mildly decreased PFT;s 01/12/16    3. SSS:  Better on lower dose lopressor to 25 bid and f/u no syncopal symptoms or HB 4. Chol:  Discussed taking 1200 mg red yeast rice daly 5. HTN:  With DM and significant CAD on ARB tolerating well Significant white coat component She assures me home readings are fine always high in our office    Current medicines are reviewed at length with the patient today.   The patient does not have concerns regarding medicines.    Signed, Jenkins Rouge, MD  04/13/2016 2:33 PM    Melissa Jackson, [place], [place]  [address] Phone: [phone]; Fax: [phone]   

## 2016-04-13 ENCOUNTER — Ambulatory Visit (INDEPENDENT_AMBULATORY_CARE_PROVIDER_SITE_OTHER): Payer: Medicare Other | Admitting: Cardiovascular Disease

## 2016-04-13 ENCOUNTER — Encounter (INDEPENDENT_AMBULATORY_CARE_PROVIDER_SITE_OTHER): Payer: Self-pay

## 2016-04-13 ENCOUNTER — Encounter: Payer: Self-pay | Admitting: Cardiovascular Disease

## 2016-04-13 VITALS — BP 172/70 | HR 51 | Ht 64.0 in | Wt 121.0 lb

## 2016-04-13 DIAGNOSIS — Z9189 Other specified personal risk factors, not elsewhere classified: Principal | ICD-10-CM

## 2016-04-13 DIAGNOSIS — Z79899 Other long term (current) drug therapy: Secondary | ICD-10-CM

## 2016-04-13 NOTE — Patient Instructions (Signed)

## 2016-05-07 DIAGNOSIS — I2510 Atherosclerotic heart disease of native coronary artery without angina pectoris: Secondary | ICD-10-CM | POA: Diagnosis not present

## 2016-05-07 DIAGNOSIS — J208 Acute bronchitis due to other specified organisms: Secondary | ICD-10-CM | POA: Diagnosis not present

## 2016-05-07 DIAGNOSIS — Z6820 Body mass index (BMI) 20.0-20.9, adult: Secondary | ICD-10-CM | POA: Diagnosis not present

## 2016-06-27 DIAGNOSIS — E119 Type 2 diabetes mellitus without complications: Secondary | ICD-10-CM | POA: Diagnosis not present

## 2016-09-04 ENCOUNTER — Other Ambulatory Visit: Payer: Self-pay | Admitting: Cardiovascular Disease

## 2016-09-07 DIAGNOSIS — I2510 Atherosclerotic heart disease of native coronary artery without angina pectoris: Secondary | ICD-10-CM | POA: Diagnosis not present

## 2016-09-07 DIAGNOSIS — E785 Hyperlipidemia, unspecified: Secondary | ICD-10-CM | POA: Diagnosis not present

## 2016-09-07 DIAGNOSIS — E039 Hypothyroidism, unspecified: Secondary | ICD-10-CM | POA: Diagnosis not present

## 2016-09-07 DIAGNOSIS — Z23 Encounter for immunization: Secondary | ICD-10-CM | POA: Diagnosis not present

## 2016-09-07 DIAGNOSIS — I10 Essential (primary) hypertension: Secondary | ICD-10-CM | POA: Diagnosis not present

## 2016-09-07 DIAGNOSIS — Z79899 Other long term (current) drug therapy: Secondary | ICD-10-CM | POA: Diagnosis not present

## 2016-09-07 DIAGNOSIS — E1129 Type 2 diabetes mellitus with other diabetic kidney complication: Secondary | ICD-10-CM | POA: Diagnosis not present

## 2016-09-26 DIAGNOSIS — M8589 Other specified disorders of bone density and structure, multiple sites: Secondary | ICD-10-CM | POA: Diagnosis not present

## 2016-09-26 DIAGNOSIS — M810 Age-related osteoporosis without current pathological fracture: Secondary | ICD-10-CM | POA: Diagnosis not present

## 2016-10-03 DIAGNOSIS — H40013 Open angle with borderline findings, low risk, bilateral: Secondary | ICD-10-CM | POA: Diagnosis not present

## 2016-11-26 NOTE — Progress Notes (Signed)
Patient ID: Melissa Jackson, female   DOB: [birth year], 76 y.o.   MRN: [id number]     Cardiology Office Note   Date:  11/27/2016   ID:  Melissa Jackson, DOB [birth year], MRN [id number]  PCP:  [name], MD  Cardiologist:   Jenkins Rouge, MD   No chief complaint on file.     History of Present Illness: Melissa Jackson is a 76 y.o. female who presents for evaluation of CAD.  Has seen Dr [name] in [facility] before I take care of her husband  Reviewed over 73 pages of notes from [name] primary in [place] and Dr [name] [place] previous cardiologist.  She is intolerant to statins Tried on multiple and all caused muscle wasting and pain.  9/232014 had classic angina and had complex intervention with Xience stent of LAD and Ramus and POB of D1  EF preserved 55%  No recurrent angina.  Carries nitro with her.  No stress testing since  Also has PAF.  With SSS.  Amiodarone has been effective.  Primary checks TSH and liver have been normal.  CXR last year 9315 reviewed normal   Noted to be bradycardic on current dose of beta blocker.  Decreased last visit to 25 bid   Tries to watch diet with DM    LDL runs 170-185 on red yeast rice HDL in 50's  Not really taking red yeast rice daily and intolerant to statins  06/2015 Beta blocker decreased for SSS and ARB added for BP Cough with lisinopril   Past Medical History:  Diagnosis Date  . Acute myocardial infarction, subendocardial infarction, initial episode of care ([place])   . Anemia   . Anticoagulated    ON PRADAXA  . Atrial fibrillation ([place])   . Coronary artery disease   . Diabetes mellitus with no complication ([place])   . Disease of tricuspid valve    MILD  . Fall   . First degree atrioventricular block   . Head ache   . High risk medication use   . Hyperlipidemia    SEVERE  . Hypertension, essential   . Hypothyroidism   . Left atrial enlargement    MILD  . Mitral valvular disorder    MILD-MODERATE  . Old myocardial infarction    JAN 2008  .  Takotsubo syndrome     Past Surgical History:  Procedure Laterality Date  . NO PAST SURGERIES       Current Outpatient Prescriptions  Medication Sig Dispense Refill  . alendronate (FOSAMAX) 70 MG tablet Take 70 mg by mouth once a week.    Marland Kitchen amiodarone (PACERONE) 200 MG tablet Take 200 mg by mouth daily.    Marland Kitchen amLODipine (NORVASC) 5 MG tablet Take 5 mg by mouth daily.    Marland Kitchen aspirin 81 MG tablet Take 81 mg by mouth daily.    . chlorthalidone (HYGROTON) 25 MG tablet Take 25 mg by mouth every morning.    . clopidogrel (PLAVIX) 75 MG tablet Take 75 mg by mouth daily.    Marland Kitchen glipiZIDE-metformin (METAGLIP) 5-500 MG tablet Take 1 tablet by mouth 2 (two) times daily.    Marland Kitchen levothyroxine (SYNTHROID, LEVOTHROID) 100 MCG tablet Take 100 mcg by mouth daily.    Marland Kitchen losartan (COZAAR) 25 MG tablet TAKE ONE TABLET BY MOUTH ONCE DAILY 90 tablet 1  . metoprolol tartrate (LOPRESSOR) 50 MG tablet Take 1 tablet (50 mg total) by mouth 2 (two) times daily. 180 tablet 3  . Multiple Vitamins-Minerals (MULTIVITAMIN WITH MINERALS)  tablet Take 1 tablet by mouth daily.    . nitroGLYCERIN (NITROSTAT) 0.4 MG SL tablet Place 0.4 mg under the tongue every 5 (five) minutes as needed for chest pain.    . Omega 3 1000 MG CAPS Take 2 capsules by mouth 2 (two) times daily.    . Red Yeast Rice 600 MG CAPS Take 1 capsule by mouth daily.      No current facility-administered medications for this visit.     Allergies:   Lisinopril; Codeine; Morphine and related; and Statins    Social History:  The patient  reports that she has never smoked. She has never used smokeless tobacco. She reports that she does not drink alcohol or use drugs.   Family History:  The patient's family history includes Alzheimer's disease in her mother; Diabetes Mellitus I in her mother; Heart attack in her father.    ROS:  Please see the history of present illness.   Otherwise, review of systems are positive for none.   All other systems are reviewed and  negative.    PHYSICAL EXAM: VS:  BP 128/68   Pulse (!) 49   Ht 5\' 4"  (1.626 m)   Wt 54.3 kg (119 lb 12.8 oz)   LMP  (LMP Unknown)   BMI 20.56 kg/m  , BMI Body mass index is 20.56 kg/m. Affect appropriate Healthy:  appears stated age 55: normal Neck supple with no adenopathy JVP normal no bruits no thyromegaly Lungs clear with no wheezing and good diaphragmatic motion Heart:  S1/S2 no murmur, no rub, gallop or click PMI normal Abdomen: benighn, BS positve, no tenderness, no AAA no bruit.  No HSM or HJR Distal pulses intact with no bruits No edema Neuro non-focal Skin warm and dry No muscular weakness      EKG:  NSR rate 51 read as junctional but not inferior ST changes no change from [place] ECG 9/15  09/27/15  SR rate 52 inferolateral T wave changes 11/27/16 SB rate 49 flat lateral ST changes    Recent Labs: No results found for requested labs within last 8760 hours.    Lipid Panel No results found for: CHOL, TRIG, HDL, CHOLHDL, VLDL, LDLCALC, LDLDIRECT    Wt Readings from Last 3 Encounters:  11/27/16 54.3 kg (119 lb 12.8 oz)  04/13/16 54.9 kg (121 lb)  09/27/15 52.4 kg (115 lb 9.6 oz)      Other studies Reviewed: Additional studies/ records that were reviewed today include: Records from [address], [place], [place] and [place] .    ASSESSMENT AND PLAN:  1.  CAD:  Given complexity of intervention continue DAT.  No angina  Has nitro continue beta blocker 2. PAF:  Continue low dose amiodarone. DLCO only mildly decreased PFT;s 01/12/16    3. SSS:  Lower dose lopressor to 25 bid and f/u no syncopal symptoms or HB 4. Chol:  Discussed taking 1200 mg red yeast rice daly 5. HTN:  With DM and significant CAD on ARB tolerating well Significant white coat component She assures me home readings are fine always high in our office  6. Thyroid TSH 13 in may needs to increase synthroid to 125 ug f/u Dr Delena Bali  Current medicines are reviewed at length with the patient today.   The patient does not have concerns regarding medicines.    Signed, Jenkins Rouge, MD  11/27/2016 10:22 AM    [facility] [address], [place], [place]  [address] Phone: [phone]; Fax: [phone]  

## 2016-11-27 ENCOUNTER — Encounter: Payer: Self-pay | Admitting: Cardiovascular Disease

## 2016-11-27 ENCOUNTER — Ambulatory Visit (INDEPENDENT_AMBULATORY_CARE_PROVIDER_SITE_OTHER): Payer: Medicare Other | Admitting: Cardiovascular Disease

## 2016-11-27 VITALS — BP 128/68 | HR 49 | Ht 64.0 in | Wt 119.8 lb

## 2016-11-27 DIAGNOSIS — I480 Paroxysmal atrial fibrillation: Secondary | ICD-10-CM

## 2016-11-27 DIAGNOSIS — I2510 Atherosclerotic heart disease of native coronary artery without angina pectoris: Secondary | ICD-10-CM

## 2016-11-27 MED ORDER — METOPROLOL TARTRATE 25 MG PO TABS: 25.0000 mg | ORAL_TABLET | Freq: Two times a day (BID) | ORAL | 3 refills | Status: DC

## 2016-11-27 NOTE — Patient Instructions (Addendum)
Medication Instructions:  Your physician has recommended you make the following change in your medication:  1-Decrease Metoprolol 25 mg by mouth twice daily  Labwork: NONE  Testing/Procedures: NONE  Follow-Up: Your physician wants you to follow-up in: 6 months with Dr. Johnsie Cancel. You will receive a reminder letter in the mail two months in advance. If you don't receive a letter, please call our office to schedule the follow-up appointment.  Your physician wants you to call Dr. Denton Lank office about your dose of synthroid. According to your last lab results you should be taking Synthroid 125 mcg.  If you need a refill on your cardiac medications before your next appointment, please call your pharmacy.

## 2017-03-08 DIAGNOSIS — E785 Hyperlipidemia, unspecified: Secondary | ICD-10-CM | POA: Diagnosis not present

## 2017-03-08 DIAGNOSIS — I10 Essential (primary) hypertension: Secondary | ICD-10-CM | POA: Diagnosis not present

## 2017-03-08 DIAGNOSIS — E1129 Type 2 diabetes mellitus with other diabetic kidney complication: Secondary | ICD-10-CM | POA: Diagnosis not present

## 2017-03-08 DIAGNOSIS — I2510 Atherosclerotic heart disease of native coronary artery without angina pectoris: Secondary | ICD-10-CM | POA: Diagnosis not present

## 2017-03-08 DIAGNOSIS — Z9181 History of falling: Secondary | ICD-10-CM | POA: Diagnosis not present

## 2017-03-08 DIAGNOSIS — E039 Hypothyroidism, unspecified: Secondary | ICD-10-CM | POA: Diagnosis not present

## 2017-03-08 DIAGNOSIS — Z79899 Other long term (current) drug therapy: Secondary | ICD-10-CM | POA: Diagnosis not present

## 2017-03-08 DIAGNOSIS — Z23 Encounter for immunization: Secondary | ICD-10-CM | POA: Diagnosis not present

## 2017-03-14 ENCOUNTER — Other Ambulatory Visit: Payer: Self-pay | Admitting: Cardiovascular Disease

## 2017-04-05 DIAGNOSIS — N179 Acute kidney failure, unspecified: Secondary | ICD-10-CM | POA: Diagnosis not present

## 2017-04-11 DIAGNOSIS — E119 Type 2 diabetes mellitus without complications: Secondary | ICD-10-CM | POA: Diagnosis not present

## 2017-04-30 DIAGNOSIS — Z136 Encounter for screening for cardiovascular disorders: Secondary | ICD-10-CM | POA: Diagnosis not present

## 2017-04-30 DIAGNOSIS — Z1331 Encounter for screening for depression: Secondary | ICD-10-CM | POA: Diagnosis not present

## 2017-04-30 DIAGNOSIS — Z0000 Encounter for general adult medical examination without abnormal findings: Secondary | ICD-10-CM | POA: Diagnosis not present

## 2017-04-30 DIAGNOSIS — E785 Hyperlipidemia, unspecified: Secondary | ICD-10-CM | POA: Diagnosis not present

## 2017-04-30 DIAGNOSIS — Z1231 Encounter for screening mammogram for malignant neoplasm of breast: Secondary | ICD-10-CM | POA: Diagnosis not present

## 2017-05-06 DIAGNOSIS — Z1231 Encounter for screening mammogram for malignant neoplasm of breast: Secondary | ICD-10-CM | POA: Diagnosis not present

## 2017-09-06 DIAGNOSIS — Z139 Encounter for screening, unspecified: Secondary | ICD-10-CM | POA: Diagnosis not present

## 2017-09-06 DIAGNOSIS — E1129 Type 2 diabetes mellitus with other diabetic kidney complication: Secondary | ICD-10-CM | POA: Diagnosis not present

## 2017-09-06 DIAGNOSIS — E785 Hyperlipidemia, unspecified: Secondary | ICD-10-CM | POA: Diagnosis not present

## 2017-09-06 DIAGNOSIS — I10 Essential (primary) hypertension: Secondary | ICD-10-CM | POA: Diagnosis not present

## 2017-09-06 DIAGNOSIS — I2510 Atherosclerotic heart disease of native coronary artery without angina pectoris: Secondary | ICD-10-CM | POA: Diagnosis not present

## 2017-09-06 DIAGNOSIS — E039 Hypothyroidism, unspecified: Secondary | ICD-10-CM | POA: Diagnosis not present

## 2017-09-06 DIAGNOSIS — Z1331 Encounter for screening for depression: Secondary | ICD-10-CM | POA: Diagnosis not present

## 2017-09-06 DIAGNOSIS — Z79899 Other long term (current) drug therapy: Secondary | ICD-10-CM | POA: Diagnosis not present

## 2017-09-09 DIAGNOSIS — D509 Iron deficiency anemia, unspecified: Secondary | ICD-10-CM | POA: Diagnosis not present

## 2017-09-10 ENCOUNTER — Telehealth: Payer: Self-pay

## 2017-09-10 DIAGNOSIS — D509 Iron deficiency anemia, unspecified: Secondary | ICD-10-CM

## 2017-09-10 NOTE — Telephone Encounter (Signed)
Per Dr. Lyndel Safe start iron 325mg  three times daily with meals. CMP and Iron Panel in 4 weeks.

## 2017-09-13 ENCOUNTER — Encounter: Payer: Self-pay | Admitting: Gastroenterology

## 2017-09-25 ENCOUNTER — Encounter: Payer: Self-pay | Admitting: Cardiovascular Disease

## 2017-10-07 NOTE — Progress Notes (Signed)
Patient ID: Keeara Frees, female   DOB: June 05, 1940, 77 y.o.   MRN: [id number]     Cardiology Office Note   Date:  10/10/2017   ID:  Philip Eckersley, DOB 02-21-1941, MRN [id number]  PCP:  [name], MD  Cardiologist:   Jenkins Rouge, MD   No chief complaint on file.     History of Present Illness:  77 y.o. f/u CAD. Lives in [place] and sees [name] as primary. Previous patient of [name]. September 2014 presented with angina DES to LAD and Ramus with POB of D1 EF 55% PAF with SSS. Maintaining NSR on amiodarone Beta blocker dose has been decreased due to low HR's. Intolerant to statin with muscle wasting and pain Cough with ACE but tolerates ACE for BP control.   No dyspnea needs DLCO on amiodarone Rx No angina or chest pain compliant with meds   Past Medical History:  Diagnosis Date  . Acute myocardial infarction, subendocardial infarction, initial episode of care ([place])   . Anemia   . Anticoagulated    ON PRADAXA  . Atrial fibrillation ([place])   . Coronary artery disease   . Diabetes mellitus with no complication ([place])   . Disease of tricuspid valve    MILD  . Fall   . First degree atrioventricular block   . Head ache   . High risk medication use   . History of total hysterectomy   . Hyperlipidemia    SEVERE  . Hypertension, essential   . Hypothyroidism   . Left atrial enlargement    MILD  . Mitral valvular disorder    MILD-MODERATE  . Old myocardial infarction    JAN 2008  . Takotsubo syndrome     Past Surgical History:  Procedure Laterality Date  . APPENDECTOMY    . COLONOSCOPY  03/28/2005   Moderate left colonic diverticulosis. Internal hemorrhoids.  . INCONTINENCE SURGERY    . NO PAST SURGERIES    . TOTAL ABDOMINAL HYSTERECTOMY       Current Outpatient Medications  Medication Sig Dispense Refill  . alendronate (FOSAMAX) 70 MG tablet Take 70 mg by mouth once a week.    Marland Kitchen amiodarone (PACERONE) 200 MG tablet Take 200 mg by mouth daily.    Marland Kitchen  amLODipine (NORVASC) 5 MG tablet Take 5 mg by mouth daily.    Marland Kitchen aspirin 81 MG tablet Take 81 mg by mouth daily.    . chlorthalidone (HYGROTON) 25 MG tablet Take 25 mg by mouth every morning.    . clopidogrel (PLAVIX) 75 MG tablet Take 75 mg by mouth daily.    . ferrous sulfate 325 (65 FE) MG tablet Take 325 mg by mouth 3 (three) times daily with meals.    Marland Kitchen glipiZIDE-metformin (METAGLIP) 5-500 MG tablet Take 1 tablet by mouth 2 (two) times daily.    Marland Kitchen levothyroxine (SYNTHROID, LEVOTHROID) 125 MCG tablet Take 125 mcg by mouth daily before breakfast.    . losartan (COZAAR) 25 MG tablet TAKE 1 TABLET BY MOUTH ONCE DAILY 90 tablet 2  . metoprolol tartrate (LOPRESSOR) 25 MG tablet Take 1 tablet (25 mg total) by mouth 2 (two) times daily. 180 tablet 3  . Multiple Vitamins-Minerals (MULTIVITAMIN WITH MINERALS) tablet Take 1 tablet by mouth daily.    . nitroGLYCERIN (NITROSTAT) 0.4 MG SL tablet Place 0.4 mg under the tongue every 5 (five) minutes as needed for chest pain.    . Omega 3 1000 MG CAPS Take 2 capsules by mouth 2 (  two) times daily.    . pravastatin (PRAVACHOL) 20 MG tablet as directed.  5  . Red Yeast Rice 600 MG CAPS Take 1 capsule by mouth daily.      No current facility-administered medications for this visit.     Allergies:   Lisinopril; Codeine; Morphine and related; and Statins    Social History:  The patient  reports that she has never smoked. She has never used smokeless tobacco. She reports that she does not drink alcohol or use drugs.   Family History:  The patient's family history includes Alzheimer's disease in her mother; Diabetes Mellitus I in her mother; Heart attack in her father.    ROS:  Please see the history of present illness.   Otherwise, review of systems are positive for none.   All other systems are reviewed and negative.    PHYSICAL EXAM: VS:  BP (!) 142/80   Pulse 70   Ht 5\' 4"  (1.626 m)   Wt 133 lb 8 oz (60.6 kg)   LMP  (LMP Unknown)   SpO2 97%   BMI  22.92 kg/m  , BMI Body mass index is 22.92 kg/m. Affect appropriate Healthy:  appears stated age 45: normal Neck supple with no adenopathy JVP normal no bruits no thyromegaly Lungs clear with no wheezing and good diaphragmatic motion Heart:  S1/S2 no murmur, no rub, gallop or click PMI normal Abdomen: benighn, BS positve, no tenderness, no AAA no bruit.  No HSM or HJR Distal pulses intact with no bruits No edema Neuro non-focal Skin warm and dry No muscular weakness     EKG: 10/10/17 SR rate 58 nonspecific ST changes    Recent Labs: No results found for requested labs within last 8760 hours.    Lipid Panel No results found for: CHOL, TRIG, HDL, CHOLHDL, VLDL, LDLCALC, LDLDIRECT    Wt Readings from Last 3 Encounters:  10/10/17 133 lb 8 oz (60.6 kg)  11/27/16 119 lb 12.8 oz (54.3 kg)  04/13/16 121 lb (54.9 kg)      Other studies Reviewed: Additional studies/ records that were reviewed today include: Records from [place], [place], [place] and [place] .    ASSESSMENT AND PLAN:  1.  CAD:  Given complexity of intervention continue DAT.  No angina  Has nitro continue beta blocker 2. PAF:  Continue low dose amiodarone. DLCO only mildly decreased PFT;s 01/12/16 will update   3. SSS:  Lower dose lopressor to 25 bid and f/u no syncopal symptoms or HB 4. Chol:  Discussed taking 1200 mg red yeast rice daly   5. HTN:  With DM and significant CAD on ARB tolerating well Significant white coat component She assures me home readings are fine always high in our office  6. Thyroid TSH 13 in may needs to increase synthroid to 125 ug f/u Dr Delena Bali  Current medicines are reviewed at length with the patient today.  The patient does not have concerns regarding medicines.    Signed, Jenkins Rouge, MD  10/10/2017 3:45 PM    [facility] [place], [place], [place]  [address] Phone: [phone]; Fax: [phone]

## 2017-10-08 DIAGNOSIS — D509 Iron deficiency anemia, unspecified: Secondary | ICD-10-CM | POA: Diagnosis not present

## 2017-10-09 ENCOUNTER — Ambulatory Visit: Payer: Medicare Other | Admitting: Cardiovascular Disease

## 2017-10-09 DIAGNOSIS — H40013 Open angle with borderline findings, low risk, bilateral: Secondary | ICD-10-CM | POA: Diagnosis not present

## 2017-10-10 ENCOUNTER — Encounter: Payer: Self-pay | Admitting: Gastroenterology

## 2017-10-10 ENCOUNTER — Ambulatory Visit: Payer: Medicare Other | Admitting: Cardiovascular Disease

## 2017-10-10 ENCOUNTER — Encounter: Payer: Self-pay | Admitting: Cardiovascular Disease

## 2017-10-10 VITALS — BP 142/80 | HR 70 | Ht 64.0 in | Wt 133.5 lb

## 2017-10-10 DIAGNOSIS — Z79899 Other long term (current) drug therapy: Secondary | ICD-10-CM | POA: Diagnosis not present

## 2017-10-10 DIAGNOSIS — I2510 Atherosclerotic heart disease of native coronary artery without angina pectoris: Secondary | ICD-10-CM

## 2017-10-10 DIAGNOSIS — I480 Paroxysmal atrial fibrillation: Secondary | ICD-10-CM

## 2017-10-10 NOTE — Patient Instructions (Signed)
Medication Instructions:  Your physician recommends that you continue on your current medications as directed. Please refer to the Current Medication list given to you today.  Labwork: NONE  Testing/Procedures: Your physician has recommended that you have a pulmonary function test. Pulmonary Function Tests are a group of tests that measure how well air moves in and out of your lungs.  Follow-Up: Your physician wants you to follow-up in: 6 months with Dr. Johnsie Cancel. You will receive a reminder letter in the mail two months in advance. If you don't receive a letter, please call our office to schedule the follow-up appointment.   If you need a refill on your cardiac medications before your next appointment, please call your pharmacy.

## 2017-10-11 ENCOUNTER — Encounter: Payer: Self-pay | Admitting: Gastroenterology

## 2017-10-11 ENCOUNTER — Telehealth: Payer: Self-pay

## 2017-10-11 ENCOUNTER — Ambulatory Visit (INDEPENDENT_AMBULATORY_CARE_PROVIDER_SITE_OTHER): Payer: Medicare Other | Admitting: Gastroenterology

## 2017-10-11 VITALS — BP 126/70 | HR 62 | Ht 64.0 in | Wt 133.0 lb

## 2017-10-11 DIAGNOSIS — D509 Iron deficiency anemia, unspecified: Secondary | ICD-10-CM

## 2017-10-11 NOTE — Patient Instructions (Signed)
If you are age 79 or older, your body mass index should be between 23-30. Your Body mass index is 22.83 kg/m. If this is out of the aforementioned range listed, please consider follow up with your Primary Care Provider.  If you are age 43 or younger, your body mass index should be between 19-25. Your Body mass index is 22.83 kg/m. If this is out of the aformentioned range listed, please consider follow up with your Primary Care Provider.   You have been scheduled for a colonoscopy. Please follow written instructions given to you at your visit today.  Please pick up your prep supplies at the pharmacy within the next 1-3 days. If you use inhalers (even only as needed), please bring them with you on the day of your procedure. Your physician has requested that you go to www.startemmi.com and enter the access code given to you at your visit today. This web site gives a general overview about your procedure. However, you should still follow specific instructions given to you by our office regarding your preparation for the procedure.  You will be contacted by our office prior to your procedure for directions on holding your Plavix.  If you do not hear from our office 1 week prior to your scheduled procedure, please call [phone] to discuss.   Please purchase the following medications over the counter and take as directed: Miralax Dulcolax  Thank you,  Dr. Jackquline Denmark

## 2017-10-11 NOTE — Telephone Encounter (Signed)
[facility] Pre-operative Risk Assessment     Request for surgical clearance:     Endoscopy Procedure  What type of surgery is being performed?     Colonoscopy  When is this surgery scheduled?     11/14/17  What type of clearance is required ?   Pharmacy  Are there any medications that need to be held prior to surgery and how long? Plavix  Practice name and name of physician performing surgery?      [facility] Jackquline Denmark  What is your office phone and fax number?      Phone- [phone]  [phone]  Anesthesia type (None, local, MAC, general) ?       MAC

## 2017-10-11 NOTE — Progress Notes (Signed)
Chief Complaint: Anemia  Referring Provider:  [name], MD      ASSESSMENT AND PLAN;   #1. IDA with heme negative stools, mild CRI with Cr 1.31 with GFR 39 mL/min 09/2017.  Hemoglobin from 9.8 to 11.7 10/2017,   #2. Multiple comorbid conditions-stable CAD, PAF, SSS, hypercholesterolemia, HTN and hypothyroidism. Last note from cardiology reviewed. Plan: - I have discussed extensively with the patient patient's husband.  They would like to proceed with colonoscopy. She will be given Miralax prep. I have explained the risks and benefits in detail including small but definite risks of colonic perforation requiring laparotomy, bleeding requiring blood transfusions, really missing colorectal neoplasms. - Continue baby ASA - Hold plavix 5 days prior to colonoscopy. - Seen Dr Johnsie Cancel yesterday. Neg cardiac work-up.  - Continue iron supplements for now. - Continue to monitor hemoglobin/hematocrit periodically.  If she continues to be anemic or if there is worsening anemia, then would consider upper GI evaluation possibly followed by CT scan of the abdomen and pelvis.  No indication for capsule endoscopy at this time since she had heme-negative stools.   HPI:   77 year old white female  With iron def anemia with mild CRI with Cr 1.31 with GFR 39 mL/min 09/2017.  Hemoglobin from 9.8 to 11.7 10/2017.  Iron studies showing iron saturation 8%, iron 26, TIBC 317 on 09/09/2017.  With heme negative stools. No nausea, vomiting, heartburn, regurgitation, odynophagia or dysphagia.  No significant diarrhea or constipation.  There is no melena or hematochezia. No unintentional weight loss. No history of craving of ice. Last colonoscopy March 28, 2005-negative except for moderate left colonic diverticulosis. Advised to get repeat colonoscopy performed by Dr. Delena Bali.    Past Medical History:  Diagnosis Date  . Acute myocardial infarction, subendocardial infarction, initial episode of care ([place])     . Anemia   . Anticoagulated    ON PRADAXA  . Atrial fibrillation ([place])   . Coronary artery disease   . Diabetes mellitus with no complication ([place])   . Disease of tricuspid valve    MILD  . Fall   . First degree atrioventricular block   . Head ache   . High risk medication use   . History of total hysterectomy   . Hyperlipidemia    SEVERE  . Hypertension, essential   . Hypothyroidism   . Left atrial enlargement    MILD  . Mitral valvular disorder    MILD-MODERATE  . Old myocardial infarction    JAN 2008  . Takotsubo syndrome     Past Surgical History:  Procedure Laterality Date  . APPENDECTOMY    . COLONOSCOPY  03/28/2005   Moderate left colonic diverticulosis. Internal hemorrhoids.  . INCONTINENCE SURGERY    . NO PAST SURGERIES    . TOTAL ABDOMINAL HYSTERECTOMY      Family History  Problem Relation Age of Onset  . Heart attack Father   . Alzheimer's disease Mother   . Diabetes Mellitus I Mother   . Colon cancer Neg Hx     Social History   Tobacco Use  . Smoking status: Never Smoker  . Smokeless tobacco: Never Used  Substance Use Topics  . Alcohol use: No    Alcohol/week: 0.0 oz  . Drug use: No    Current Outpatient Medications  Medication Sig Dispense Refill  . alendronate (FOSAMAX) 70 MG tablet Take 70 mg by mouth once a week.    Marland Kitchen amiodarone (PACERONE) 200 MG tablet Take  200 mg by mouth daily.    Marland Kitchen amLODipine (NORVASC) 5 MG tablet Take 5 mg by mouth daily.    Marland Kitchen aspirin 81 MG tablet Take 81 mg by mouth daily.    . chlorthalidone (HYGROTON) 25 MG tablet Take 25 mg by mouth every morning.    . clopidogrel (PLAVIX) 75 MG tablet Take 75 mg by mouth daily.    . ferrous sulfate 325 (65 FE) MG tablet Take 325 mg by mouth 3 (three) times daily with meals.    Marland Kitchen glipiZIDE-metformin (METAGLIP) 5-500 MG tablet Take 1 tablet by mouth 2 (two) times daily.    Marland Kitchen levothyroxine (SYNTHROID, LEVOTHROID) 125 MCG tablet Take 125 mcg by mouth daily before breakfast.     . losartan (COZAAR) 25 MG tablet TAKE 1 TABLET BY MOUTH ONCE DAILY 90 tablet 2  . metoprolol tartrate (LOPRESSOR) 25 MG tablet Take 1 tablet (25 mg total) by mouth 2 (two) times daily. 180 tablet 3  . Multiple Vitamins-Minerals (MULTIVITAMIN WITH MINERALS) tablet Take 1 tablet by mouth daily.    . nitroGLYCERIN (NITROSTAT) 0.4 MG SL tablet Place 0.4 mg under the tongue every 5 (five) minutes as needed for chest pain.    . Omega 3 1000 MG CAPS Take 2 capsules by mouth 2 (two) times daily.    . pravastatin (PRAVACHOL) 20 MG tablet as directed.  5  . Red Yeast Rice 600 MG CAPS Take 1 capsule by mouth daily.      No current facility-administered medications for this visit.     Allergies  Allergen Reactions  . Lisinopril     Distant ? cough  . Codeine Diarrhea  . Morphine And Related Other (See Comments)    GI UPSET  . Statins Other (See Comments)    ARTHRALGIA    Review of Systems:  Constitutional: Denies fever, chills, diaphoresis, appetite change and fatigue.  HEENT: Denies photophobia, eye pain, redness, hearing loss, ear pain, congestion, sore throat, rhinorrhea, sneezing, mouth sores, neck pain, neck stiffness and tinnitus.   Respiratory: Denies SOB, DOE, cough, chest tightness,  and wheezing.   Cardiovascular: Denies chest pain, palpitations and leg swelling.  Genitourinary: Denies dysuria, urgency, frequency, hematuria, flank pain and difficulty urinating.  Musculoskeletal: Denies myalgias, back pain, joint swelling, arthralgias and gait problem.  Skin: No rash.  Neurological: Denies dizziness, seizures, syncope, weakness, light-headedness, numbness and headaches.  Hematological: Denies adenopathy. Easy bruising, personal or family bleeding history  Psychiatric/Behavioral: No anxiety or depression     Physical Exam:    BP 126/70   Pulse 62   Ht 5\' 4"  (1.626 m)   Wt 133 lb (60.3 kg)   LMP  (LMP Unknown)   BMI 22.83 kg/m  Filed Weights   10/11/17 1320  Weight: 133 lb  (60.3 kg)   Constitutional:  Well-developed, in no acute distress. Psychiatric: Normal mood and affect. Behavior is normal. HEENT: Pupils normal.  Conjunctivae are normal. No scleral icterus. Neck supple.  Cardiovascular: Normal rate, regular rhythm. No edema Pulmonary/chest: Effort normal and breath sounds normal. No wheezing, rales or rhonchi. Abdominal: Soft, nondistended. Nontender. Bowel sounds active throughout. There are no masses palpable. No hepatomegaly. Rectal:  defered Neurological: Alert and oriented to person place and time. Skin: Skin is warm and dry. No rashes noted.  Data Reviewed: I have personally reviewed following labs and imaging studies  CBC: No flowsheet data found.  CMP: No flowsheet data found.  GFR: CrCl cannot be calculated (No order found.). Liver Function Tests:  No results for input(s): AST, ALT, ALKPHOS, BILITOT, PROT, ALBUMIN in the last 168 hours. No results for input(s): LIPASE, AMYLASE in the last 168 hours. No results for input(s): AMMONIA in the last 168 hours. Coagulation Profile: No results for input(s): INR, PROTIME in the last 168 hours. HbA1C: No results for input(s): HGBA1C in the last 72 hours. Lipid Profile: No results for input(s): CHOL, HDL, LDLCALC, TRIG, CHOLHDL, LDLDIRECT in the last 72 hours. Thyroid Function Tests: No results for input(s): TSH, T4TOTAL, FREET4, T3FREE, THYROIDAB in the last 72 hours. Anemia Panel: No results for input(s): VITAMINB12, FOLATE, FERRITIN, TIBC, IRON, RETICCTPCT in the last 72 hours.  No results found for this or any previous visit (from the past 240 hour(s)).    Radiology Studies: No results found.    Carmell Austria, MD   Cc: [name], MD

## 2017-10-16 NOTE — Telephone Encounter (Signed)
Ok to hold plavix for colonoscopy 

## 2017-10-16 NOTE — Telephone Encounter (Signed)
Patient informed by phone.

## 2017-10-16 NOTE — Telephone Encounter (Signed)
   Primary Cardiologist: Dr Johnsie Cancel  Chart reviewed as part of pre-operative protocol coverage. Given past medical history and time since last visit, based on ACC/AHA guidelines, Melissa Jackson would be at acceptable risk for the planned procedure without further cardiovascular testing.   I will route this recommendation to the requesting party via Epic fax function and remove from pre-op pool.  Please call with questions.  Kerin Ransom, PA-C 10/16/2017, 10:18 AM

## 2017-10-25 ENCOUNTER — Ambulatory Visit (INDEPENDENT_AMBULATORY_CARE_PROVIDER_SITE_OTHER): Payer: Medicare Other | Admitting: Internal Medicine

## 2017-10-25 DIAGNOSIS — Z79899 Other long term (current) drug therapy: Secondary | ICD-10-CM | POA: Diagnosis not present

## 2017-10-25 DIAGNOSIS — I480 Paroxysmal atrial fibrillation: Secondary | ICD-10-CM

## 2017-10-25 DIAGNOSIS — I2510 Atherosclerotic heart disease of native coronary artery without angina pectoris: Secondary | ICD-10-CM

## 2017-10-25 LAB — PULMONARY FUNCTION TEST
DL/VA % pred: 78 %
DL/VA: 3.77 ml/min/mmHg/L
DLCO unc % pred: 69 %
DLCO unc: 16.94 ml/min/mmHg
FEF 25-75 Post: 2.18 L/sec
FEF 25-75 Pre: 2.12 L/sec
FEF2575-%Change-Post: 3 %
FEF2575-%Pred-Post: 151 %
FEF2575-%Pred-Pre: 147 %
FEV1-%Change-Post: 0 %
FEV1-%PRED-POST: 125 %
FEV1-%Pred-Pre: 124 %
FEV1-POST: 2.46 L
FEV1-PRE: 2.44 L
FEV1FVC-%CHANGE-POST: 3 %
FEV1FVC-%Pred-Pre: 104 %
FEV6-%Change-Post: -2 %
FEV6-%PRED-PRE: 126 %
FEV6-%Pred-Post: 122 %
FEV6-PRE: 3.15 L
FEV6-Post: 3.06 L
FEV6FVC-%Change-Post: 0 %
FEV6FVC-%PRED-PRE: 104 %
FEV6FVC-%Pred-Post: 104 %
FVC-%CHANGE-POST: -2 %
FVC-%PRED-PRE: 119 %
FVC-%Pred-Post: 116 %
FVC-POST: 3.08 L
FVC-PRE: 3.16 L
POST FEV6/FVC RATIO: 100 %
PRE FEV6/FVC RATIO: 100 %
Post FEV1/FVC ratio: 80 %
Pre FEV1/FVC ratio: 77 %
RV % PRED: 81 %
RV: 1.94 L
TLC % pred: 97 %
TLC: 4.92 L

## 2017-10-25 NOTE — Progress Notes (Signed)
PFT done today. 

## 2017-10-30 ENCOUNTER — Encounter: Payer: Self-pay | Admitting: Gastroenterology

## 2017-11-07 ENCOUNTER — Telehealth: Payer: Self-pay | Admitting: *Deleted

## 2017-11-07 NOTE — Telephone Encounter (Signed)
Called patient to confirm her holding her plavix prior to her procedure on 11/13/17. Patient was informed by cardiology to hold it 5 days prior to the procedure. Sm

## 2017-11-13 ENCOUNTER — Ambulatory Visit (AMBULATORY_SURGERY_CENTER): Payer: Medicare Other | Admitting: Gastroenterology

## 2017-11-13 ENCOUNTER — Encounter: Payer: Self-pay | Admitting: Gastroenterology

## 2017-11-13 VITALS — BP 130/49 | HR 47 | Temp 97.5°F | Resp 13 | Ht 64.0 in | Wt 133.0 lb

## 2017-11-13 DIAGNOSIS — D508 Other iron deficiency anemias: Secondary | ICD-10-CM

## 2017-11-13 DIAGNOSIS — D127 Benign neoplasm of rectosigmoid junction: Secondary | ICD-10-CM | POA: Diagnosis not present

## 2017-11-13 DIAGNOSIS — Z1211 Encounter for screening for malignant neoplasm of colon: Secondary | ICD-10-CM | POA: Diagnosis not present

## 2017-11-13 MED ORDER — SODIUM CHLORIDE 0.9 % IV SOLN: 500.0000 mL | Freq: Once | INTRAVENOUS | Status: DC

## 2017-11-13 NOTE — Op Note (Signed)
[facility] Patient Name: Melissa Jackson Procedure Date: 11/13/2017 10:37 AM MRN: [id number] Endoscopist: Jackquline Denmark , MD Age: 77 Referring MD:  Date of Birth: 01-30-1941 Gender: Female Account #: [id number] Procedure:                Colonoscopy Indications:              Iron deficiency anemia Medicines:                Monitored Anesthesia Care Procedure:                Pre-Anesthesia Assessment:                           - Prior to the procedure, a History and Physical                            was performed, and patient medications and                            allergies were reviewed. The patient is competent.                            The risks and benefits of the procedure and the                            sedation options and risks were discussed with the                            patient. All questions were answered and informed                            consent was obtained. Patient identification and                            proposed procedure were verified by the physician                            in the pre-procedure area in the procedure room.                            Mental Status Examination: alert and oriented.                            Prophylactic Antibiotics: The patient does not                            require prophylactic antibiotics. Prior                            Anticoagulants: The patient has taken aspirin, last                            dose was day of procedure. ASA Grade Assessment:  III - A patient with severe systemic disease. After                            reviewing the risks and benefits, the patient was                            deemed in satisfactory condition to undergo the                            procedure. The anesthesia plan was to use monitored                            anesthesia care (MAC). Immediately prior to                            administration of medications, the patient  was                            re-assessed for adequacy to receive sedatives. The                            heart rate, respiratory rate, oxygen saturations,                            blood pressure, adequacy of pulmonary ventilation,                            and response to care were monitored throughout the                            procedure. The physical status of the patient was                            re-assessed after the procedure.                           After obtaining informed consent, the colonoscope                            was passed under direct vision. Throughout the                            procedure, the patient's blood pressure, pulse, and                            oxygen saturations were monitored continuously. The                            Colonoscope was introduced through the anus and                            advanced to the 1 cm into the ileum. The  colonoscopy was technically difficult and complex                            due to poor bowel prep, a redundant colon,                            significant looping and a tortuous colon.                            Successful completion of the procedure was aided by                            applying abdominal pressure. The patient tolerated                            the procedure well. The quality of the bowel                            preparation was fair. Appox 75-80% of the colonic                            mucosa was visualized satisfactorily. Small and                            flat lesions could have been missed. Scope In: 10:40:35 AM Scope Out: 11:14:50 AM Scope Withdrawal Time: 0 hours 17 minutes 51 seconds  Total Procedure Duration: 0 hours 34 minutes 15 seconds  Findings:                 A 10 mm polyp was found in the recto-sigmoid colon.                            The polyp was sessile. The polyp was removed with a                            hot snare.  Resection and retrieval were complete.                           Multiple diverticula were found in the sigmoid                            colon and descending colon.                           Non-bleeding internal hemorrhoids were found during                            retroflexion. The hemorrhoids were Grade II                            (internal hemorrhoids that prolapse but reduce                            spontaneously).  The exam was otherwise without abnormality. Complications:            No immediate complications. Estimated Blood Loss:     Estimated blood loss: none. Impression:               - Colonic polyp status post polypectomy.                           - Diverticulosis in the sigmoid colon and in the                            descending colon.                           - Non-bleeding internal hemorrhoids.                           - The examination was otherwise grossly normal.                            Limited due to quality of preparation. Recommendation:           - Patient has a contact number available for                            emergencies. The signs and symptoms of potential                            delayed complications were discussed with the                            patient. Return to normal activities tomorrow.                            Written discharge instructions were provided to the                            patient.                           - Resume previous diet.                           - Resume Plavix (clopidogrel) at prior dose in 5                            days. Can continue baby aspirin for now.                           - Await pathology results.                           - Return to GI office in 12 weeks.                           -I've discussed the limitations of the above  colonoscopy. Patient did not want to drink more                            preparation and come back in  a.m. for repeat                            colonoscopy. I discussed this prior to the                            procedure and also in the recovery. Jackquline Denmark, MD 11/13/2017 11:26:28 AM This report has been signed electronically.

## 2017-11-13 NOTE — Progress Notes (Signed)
Dr. Lyndel Safe out to see pt.  Decision made to proceed with colonoscopy.

## 2017-11-13 NOTE — Progress Notes (Signed)
Called to room to assist during endoscopic procedure.  Patient ID and intended procedure confirmed with present staff. Received instructions for my participation in the procedure from the performing physician.  

## 2017-11-13 NOTE — Progress Notes (Signed)
Pt's states no medical or surgical changes since previsit or office visit. 

## 2017-11-13 NOTE — Progress Notes (Signed)
A and O x3. Report to RN. Tolerated MAC anesthesia well.

## 2017-11-13 NOTE — Patient Instructions (Signed)
   Resume Plavix in 5 days  Ok to resume baby aspirin now  Await pathology results  Return to Dr Steve Rattler office in 12 weeks ( make this appointment)   INFORMATION ON POLYPS,DIVERTICULOSIS,& HEMORRHOIDS GIVEN TO YOU TODAY   YOU HAD AN ENDOSCOPIC PROCEDURE TODAY AT [facility]:   Refer to the procedure report that was given to you for any specific questions about what was found during the examination.  If the procedure report does not answer your questions, please call your gastroenterologist to clarify.  If you requested that your care partner not be given the details of your procedure findings, then the procedure report has been included in a sealed envelope for you to review at your convenience later.  YOU SHOULD EXPECT: Some feelings of bloating in the abdomen. Passage of more gas than usual.  Walking can help get rid of the air that was put into your GI tract during the procedure and reduce the bloating. If you had a lower endoscopy (such as a colonoscopy or flexible sigmoidoscopy) you may notice spotting of blood in your stool or on the toilet paper. If you underwent a bowel prep for your procedure, you may not have a normal bowel movement for a few days.  Please Note:  You might notice some irritation and congestion in your nose or some drainage.  This is from the oxygen used during your procedure.  There is no need for concern and it should clear up in a day or so.  SYMPTOMS TO REPORT IMMEDIATELY:   Following lower endoscopy (colonoscopy or flexible sigmoidoscopy):  Excessive amounts of blood in the stool  Significant tenderness or worsening of abdominal pains  Swelling of the abdomen that is new, acute  Fever of 100F or higher    For urgent or emergent issues, a gastroenterologist can be reached at any hour by calling [phone].   DIET:  We do recommend a small meal at first, but then you may proceed to your regular diet.  Drink plenty of fluids but you  should avoid alcoholic beverages for 24 hours.  ACTIVITY:  You should plan to take it easy for the rest of today and you should NOT DRIVE or use heavy machinery until tomorrow (because of the sedation medicines used during the test).    FOLLOW UP: Our staff will call the number listed on your records the next business day following your procedure to check on you and address any questions or concerns that you may have regarding the information given to you following your procedure. If we do not reach you, we will leave a message.  However, if you are feeling well and you are not experiencing any problems, there is no need to return our call.  We will assume that you have returned to your regular daily activities without incident.  If any biopsies were taken you will be contacted by phone or by letter within the next 1-3 weeks.  Please call us at [phone] if you have not heard about the biopsies in 3 weeks.    SIGNATURES/CONFIDENTIALITY: You and/or your care partner have signed paperwork which will be entered into your electronic medical record.  These signatures attest to the fact that that the information above on your After Visit Summary has been reviewed and is understood.  Full responsibility of the confidentiality of this discharge information lies with you and/or your care-partner.

## 2017-11-13 NOTE — Progress Notes (Signed)
Pt. States she has not eaten since last Friday.  6/7.  She had a small amount of mashed potatoes and applesauce.  She did not complete her Miralax prep, as prescribed.  She states her stool is black.  She drank 4 oz apple juice this am at 0845.  Dr. Lyndel Safe advised.

## 2017-11-14 ENCOUNTER — Telehealth: Payer: Self-pay | Admitting: *Deleted

## 2017-11-14 NOTE — Telephone Encounter (Signed)
No answer. No identifier. Message left to call if questions or concerns and we will attempt to call later in the day.

## 2017-11-14 NOTE — Telephone Encounter (Signed)
No answer. No identifier. Message left to call if questions or concerns. 

## 2017-11-19 ENCOUNTER — Encounter: Payer: Self-pay | Admitting: Gastroenterology

## 2017-12-12 ENCOUNTER — Other Ambulatory Visit: Payer: Self-pay | Admitting: Cardiovascular Disease

## 2018-01-14 ENCOUNTER — Encounter: Payer: Self-pay | Admitting: Gastroenterology

## 2018-01-14 ENCOUNTER — Ambulatory Visit (INDEPENDENT_AMBULATORY_CARE_PROVIDER_SITE_OTHER): Payer: Medicare Other | Admitting: Gastroenterology

## 2018-01-14 VITALS — BP 124/62 | HR 64 | Ht 64.0 in | Wt 129.0 lb

## 2018-01-14 DIAGNOSIS — D509 Iron deficiency anemia, unspecified: Secondary | ICD-10-CM | POA: Diagnosis not present

## 2018-01-14 NOTE — Progress Notes (Signed)
IMPRESSION and PLAN:    #1. IDA with heme negative stools, mild CRI with Cr 1.31 with GFR 39 mL/min 09/2017.  Hemoglobin from 9.8 to 11.7 10/2017, Neg colon 11/13/2017 except colonic polyp (TA), diverticulosis, internal hemorrhoids (was limited due to quality of preparation, no circumferential lesions)  #2. Multiple comorbid conditions-stable CAD, PAF, SSS, hypercholesterolemia, HTN and hypothyroidism. Last note from cardiology reviewed. Plan: - I have discussed the colonoscopy results.  - Continue iron supplements for now. - Continue to monitor hemoglobin/hematocrit periodically.  If she continues to be anemic or if there is worsening anemia, then would consider upper GI evaluation possibly followed by CT scan of the abdomen and pelvis.  No indication for capsule endoscopy at this time since she had heme-negative stools.  - Patient would get labs done from Dr. Denton Lank office. She was given written instructions to get CBC and CMP checked. - Follow-up if still with problems or if she continues to be anemic.  Due to the distance of driving from [place], patient and patient's husband would like to follow-up on as-needed basis for now.  All the contact numbers were given.      HPI:    Chief Complaint:   Melissa Jackson is a 77 y.o. female    Past Medical History:  Diagnosis Date  . Acute myocardial infarction, subendocardial infarction, initial episode of care ([place])   . Anemia   . Anticoagulated    ON PRADAXA  . Atrial fibrillation ([place])   . Coronary artery disease   . Diabetes mellitus with no complication ([place])   . Disease of tricuspid valve    MILD  . Fall   . First degree atrioventricular block   . Head ache   . High risk medication use   . History of total hysterectomy   . Hyperlipidemia    SEVERE  . Hypertension, essential   . Hypothyroidism   . Left atrial enlargement    MILD  . Mitral valvular disorder    MILD-MODERATE  . Old myocardial infarction    JAN  2008  . Takotsubo syndrome     Current Outpatient Medications  Medication Sig Dispense Refill  . alendronate (FOSAMAX) 70 MG tablet Take 70 mg by mouth once a week.    Marland Kitchen amiodarone (PACERONE) 200 MG tablet Take 200 mg by mouth daily.    Marland Kitchen amLODipine (NORVASC) 5 MG tablet Take 5 mg by mouth daily.    Marland Kitchen aspirin 81 MG tablet Take 81 mg by mouth daily.    . chlorthalidone (HYGROTON) 25 MG tablet Take 25 mg by mouth every morning.    . clopidogrel (PLAVIX) 75 MG tablet Take 75 mg by mouth daily.    . ferrous sulfate 325 (65 FE) MG tablet Take 325 mg by mouth 3 (three) times daily with meals.    Marland Kitchen glipiZIDE-metformin (METAGLIP) 5-500 MG tablet Take 1 tablet by mouth 2 (two) times daily.    Marland Kitchen levothyroxine (SYNTHROID, LEVOTHROID) 125 MCG tablet Take 125 mcg by mouth daily before breakfast.    . losartan (COZAAR) 25 MG tablet TAKE 1 TABLET BY MOUTH ONCE DAILY 90 tablet 2  . metoprolol tartrate (LOPRESSOR) 25 MG tablet Take 1 tablet (25 mg total) by mouth 2 (two) times daily. 180 tablet 3  . Multiple Vitamins-Minerals (MULTIVITAMIN WITH MINERALS) tablet Take 1 tablet by mouth daily.    . nitroGLYCERIN (NITROSTAT) 0.4 MG SL tablet Place 0.4 mg under the tongue every 5 (five) minutes as needed  for chest pain.    . Omega 3 1000 MG CAPS Take 2 capsules by mouth 2 (two) times daily.    . pravastatin (PRAVACHOL) 20 MG tablet as directed.  5  . Red Yeast Rice 600 MG CAPS Take 1 capsule by mouth daily.      Current Facility-Administered Medications  Medication Dose Route Frequency Provider Last Rate Last Dose  . 0.9 %  sodium chloride infusion  500 mL Intravenous Once Jackquline Denmark, MD        Past Surgical History:  Procedure Laterality Date  . APPENDECTOMY    . COLONOSCOPY  03/28/2005   Moderate left colonic diverticulosis. Internal hemorrhoids.  . INCONTINENCE SURGERY    . NO PAST SURGERIES    . TOTAL ABDOMINAL HYSTERECTOMY      Family History  Problem Relation Age of Onset  . Heart attack  Father   . Alzheimer's disease Mother   . Diabetes Mellitus I Mother   . Colon cancer Neg Hx     Social History   Tobacco Use  . Smoking status: Never Smoker  . Smokeless tobacco: Never Used  Substance Use Topics  . Alcohol use: No    Alcohol/week: 0.0 standard drinks  . Drug use: No    Allergies  Allergen Reactions  . Lisinopril     Distant ? cough  . Codeine Diarrhea  . Morphine And Related Other (See Comments)    GI UPSET  . Statins Other (See Comments)    ARTHRALGIA     Review of Systems: All systems reviewed and negative except where noted in HPI.    Physical Exam:     BP 124/62   Pulse 64   Ht 5\' 4"  (1.626 m)   Wt 129 lb (58.5 kg)   LMP  (LMP Unknown)   BMI 22.14 kg/m  @WEIGHTLAST3 @ GENERAL:  Alert, oriented, cooperative, not in acute distress. PSYCH: :Pleasant, normal mood and affect. HEENT:  conjunctiva pink, mucous membranes moist, neck supple without masses. No jaundice. CARDIAC:  S1 S2 normal. No murmers. PULM: Normal respiratory effort, lungs CTA bilaterally, no wheezing. ABDOMEN: Inspection: No visible peristalsis, no abnormal pulsations, skin normal.  Palpation/percussion: Soft, nontender, nondistended, no rigidity, no abnormal dullness to percussion, no hepatosplenomegaly and no palpable abdominal masses.  Auscultation: Normal bowel sounds, no abdominal bruits. Rectal exam: Deferred SKIN:  turgor, no lesions seen. Musculoskeletal:  Normal muscle tone, normal strength. NEURO: Alert and oriented x 3, no focal neurologic deficits.    Desaree Downen,MD 01/14/2018, 11:40 AM   CC [name], MD

## 2018-01-14 NOTE — Patient Instructions (Addendum)
If you are age 79 or older, your body mass index should be between 23-30. Your Body mass index is 22.14 kg/m. If this is out of the aforementioned range listed, please consider follow up with your Primary Care Provider.  If you are age 44 or younger, your body mass index should be between 19-25. Your Body mass index is 22.14 kg/m. If this is out of the aformentioned range listed, please consider follow up with your Primary Care Provider.   Please have the following labs drawn from Dr. Delena Bali office: CBC, CMP   Thank you,  Dr. Jackquline Denmark

## 2018-01-27 DIAGNOSIS — M25561 Pain in right knee: Secondary | ICD-10-CM | POA: Diagnosis not present

## 2018-01-27 DIAGNOSIS — M25562 Pain in left knee: Secondary | ICD-10-CM | POA: Diagnosis not present

## 2018-01-27 DIAGNOSIS — S6991XA Unspecified injury of right wrist, hand and finger(s), initial encounter: Secondary | ICD-10-CM | POA: Diagnosis not present

## 2018-01-27 DIAGNOSIS — M19041 Primary osteoarthritis, right hand: Secondary | ICD-10-CM | POA: Diagnosis not present

## 2018-01-27 DIAGNOSIS — S4991XA Unspecified injury of right shoulder and upper arm, initial encounter: Secondary | ICD-10-CM | POA: Diagnosis not present

## 2018-01-27 DIAGNOSIS — S8992XA Unspecified injury of left lower leg, initial encounter: Secondary | ICD-10-CM | POA: Diagnosis not present

## 2018-01-27 DIAGNOSIS — S8991XA Unspecified injury of right lower leg, initial encounter: Secondary | ICD-10-CM | POA: Diagnosis not present

## 2018-01-27 DIAGNOSIS — M25531 Pain in right wrist: Secondary | ICD-10-CM | POA: Diagnosis not present

## 2018-01-27 DIAGNOSIS — M25541 Pain in joints of right hand: Secondary | ICD-10-CM | POA: Diagnosis not present

## 2018-01-27 DIAGNOSIS — M25511 Pain in right shoulder: Secondary | ICD-10-CM | POA: Diagnosis not present

## 2018-03-19 DIAGNOSIS — I2510 Atherosclerotic heart disease of native coronary artery without angina pectoris: Secondary | ICD-10-CM | POA: Diagnosis not present

## 2018-03-19 DIAGNOSIS — D509 Iron deficiency anemia, unspecified: Secondary | ICD-10-CM | POA: Diagnosis not present

## 2018-03-19 DIAGNOSIS — E039 Hypothyroidism, unspecified: Secondary | ICD-10-CM | POA: Diagnosis not present

## 2018-03-19 DIAGNOSIS — I10 Essential (primary) hypertension: Secondary | ICD-10-CM | POA: Diagnosis not present

## 2018-03-19 DIAGNOSIS — E1129 Type 2 diabetes mellitus with other diabetic kidney complication: Secondary | ICD-10-CM | POA: Diagnosis not present

## 2018-03-19 DIAGNOSIS — E785 Hyperlipidemia, unspecified: Secondary | ICD-10-CM | POA: Diagnosis not present

## 2018-03-19 DIAGNOSIS — Z23 Encounter for immunization: Secondary | ICD-10-CM | POA: Diagnosis not present

## 2018-04-15 NOTE — Progress Notes (Signed)
Patient ID: Melissa Jackson, female   DOB: [birth year], 77 y.o.   MRN: [id number]     Cardiology Office Note   Date:  04/21/2018   ID:  Melissa Jackson, DOB 1941-05-05, MRN [id number]  PCP:  [name], MD  Cardiologist:   Jenkins Rouge, MD   No chief complaint on file.     History of Present Illness:  77 y.o. f/u CAD. Lives in [place] and sees [name] as primary. Previous patient of [name]. September 2014 presented with angina DES to LAD and Ramus with POB of D1 EF 55% PAF with SSS. Maintaining NSR on amiodarone Beta blocker dose has been decreased due to low HR's. Intolerant to statin with muscle wasting and pain Cough with ACE but tolerates ACE for BP control.   No dyspnea needs DLCO on amiodarone Rx No angina or chest pain compliant with meds   Doing well with no angina or dyspnea   Past Medical History:  Diagnosis Date  . Acute myocardial infarction, subendocardial infarction, initial episode of care ([place])   . Anemia   . Anticoagulated    ON PRADAXA  . Atrial fibrillation ([place])   . Coronary artery disease   . Diabetes mellitus with no complication ([place])   . Disease of tricuspid valve    MILD  . Fall   . First degree atrioventricular block   . Head ache   . High risk medication use   . History of total hysterectomy   . Hyperlipidemia    SEVERE  . Hypertension, essential   . Hypothyroidism   . Left atrial enlargement    MILD  . Mitral valvular disorder    MILD-MODERATE  . Old myocardial infarction    JAN 2008  . Takotsubo syndrome     Past Surgical History:  Procedure Laterality Date  . APPENDECTOMY    . COLONOSCOPY  03/28/2005   Moderate left colonic diverticulosis. Internal hemorrhoids.  . INCONTINENCE SURGERY    . NO PAST SURGERIES    . TOTAL ABDOMINAL HYSTERECTOMY       Current Outpatient Medications  Medication Sig Dispense Refill  . alendronate (FOSAMAX) 70 MG tablet Take 70 mg by mouth once a week.    Marland Kitchen amiodarone (PACERONE) 200 MG  tablet Take 200 mg by mouth daily.    Marland Kitchen amLODipine (NORVASC) 5 MG tablet Take 5 mg by mouth daily.    Marland Kitchen aspirin 81 MG tablet Take 81 mg by mouth daily.    . chlorthalidone (HYGROTON) 25 MG tablet Take 25 mg by mouth every morning.    . clopidogrel (PLAVIX) 75 MG tablet Take 75 mg by mouth daily.    . ferrous sulfate 325 (65 FE) MG tablet Take 325 mg by mouth 3 (three) times daily with meals.    Marland Kitchen glipiZIDE-metformin (METAGLIP) 5-500 MG tablet Take 1 tablet by mouth 2 (two) times daily.    Marland Kitchen levothyroxine (SYNTHROID, LEVOTHROID) 125 MCG tablet Take 125 mcg by mouth daily before breakfast.    . losartan (COZAAR) 25 MG tablet TAKE 1 TABLET BY MOUTH ONCE DAILY 90 tablet 2  . metoprolol tartrate (LOPRESSOR) 25 MG tablet Take 1 tablet (25 mg total) by mouth 2 (two) times daily. 180 tablet 3  . Multiple Vitamins-Minerals (MULTIVITAMIN WITH MINERALS) tablet Take 1 tablet by mouth daily.    . nitroGLYCERIN (NITROSTAT) 0.4 MG SL tablet Place 0.4 mg under the tongue every 5 (five) minutes as needed for chest pain.    . Omega 3  1000 MG CAPS Take 2 capsules by mouth 2 (two) times daily.    . pravastatin (PRAVACHOL) 20 MG tablet as directed.  5  . Red Yeast Rice 600 MG CAPS Take 1 capsule by mouth daily.      Current Facility-Administered Medications  Medication Dose Route Frequency Provider Last Rate Last Dose  . 0.9 %  sodium chloride infusion  500 mL Intravenous Once Jackquline Denmark, MD        Allergies:   Lisinopril; Codeine; Morphine and related; and Statins    Social History:  The patient  reports that she has never smoked. She has never used smokeless tobacco. She reports that she does not drink alcohol or use drugs.   Family History:  The patient's family history includes Alzheimer's disease in her mother; Diabetes Mellitus I in her mother; Heart attack in her father.    ROS:  Please see the history of present illness.   Otherwise, review of systems are positive for none.   All other systems are  reviewed and negative.    PHYSICAL EXAM: VS:  BP 128/68   Pulse (!) 52   Ht 5\' 4"  (1.626 m)   Wt 125 lb 4 oz (56.8 kg)   LMP  (LMP Unknown)   SpO2 98%   BMI 21.50 kg/m  , BMI Body mass index is 21.5 kg/m. Affect appropriate Healthy:  appears stated age 9: normal Neck supple with no adenopathy JVP normal no bruits no thyromegaly Lungs clear with no wheezing and good diaphragmatic motion Heart:  S1/S2 no murmur, no rub, gallop or click PMI normal Abdomen: benighn, BS positve, no tenderness, no AAA no bruit.  No HSM or HJR Distal pulses intact with no bruits No edema Neuro non-focal Skin warm and dry No muscular weakness     EKG: 10/10/17 SR rate 58 nonspecific ST changes    Recent Labs: No results found for requested labs within last 8760 hours.    Lipid Panel No results found for: CHOL, TRIG, HDL, CHOLHDL, VLDL, LDLCALC, LDLDIRECT    Wt Readings from Last 3 Encounters:  04/21/18 125 lb 4 oz (56.8 kg)  01/14/18 129 lb (58.5 kg)  11/13/17 133 lb (60.3 kg)      Other studies Reviewed: Additional studies/ records that were reviewed today include: Records from [place], [place], [place] and [place] .    ASSESSMENT AND PLAN:  1.  CAD:  Given complexity of intervention 2014 continue DAT.  No angina medical Rx 2. PAF:  Continue low dose amiodarone. PFTls ok 10/25/17   3. SSS:  Lower dose lopressor to 25 bid and f/u no syncopal symptoms or HB 4. Chol:  Discussed taking 1200 mg red yeast rice daly   5. HTN:  With DM and significant CAD on ARB tolerating well Significant white coat component She assures me home readings are fine always high in our office  6. Thyroid TSH 13 in may needs to increase synthroid to 125 ug f/u Dr Delena Bali  Current medicines are reviewed at length with the patient today.  The patient does not have concerns regarding medicines.    Signed, Jenkins Rouge, MD  04/21/2018 11:27 AM    [facility] [address],  [place], [place]  [address] Phone: [phone]; Fax: [phone]

## 2018-04-21 ENCOUNTER — Encounter: Payer: Self-pay | Admitting: Cardiovascular Disease

## 2018-04-21 ENCOUNTER — Ambulatory Visit: Payer: Medicare Other | Admitting: Cardiovascular Disease

## 2018-04-21 VITALS — BP 128/68 | HR 52 | Ht 64.0 in | Wt 125.2 lb

## 2018-04-21 DIAGNOSIS — I2510 Atherosclerotic heart disease of native coronary artery without angina pectoris: Secondary | ICD-10-CM | POA: Diagnosis not present

## 2018-04-21 NOTE — Patient Instructions (Signed)

## 2018-04-24 DIAGNOSIS — E119 Type 2 diabetes mellitus without complications: Secondary | ICD-10-CM | POA: Diagnosis not present

## 2018-05-13 DIAGNOSIS — Z1231 Encounter for screening mammogram for malignant neoplasm of breast: Secondary | ICD-10-CM | POA: Diagnosis not present

## 2018-05-15 DIAGNOSIS — Z136 Encounter for screening for cardiovascular disorders: Secondary | ICD-10-CM | POA: Diagnosis not present

## 2018-05-15 DIAGNOSIS — Z0000 Encounter for general adult medical examination without abnormal findings: Secondary | ICD-10-CM | POA: Diagnosis not present

## 2018-05-15 DIAGNOSIS — E785 Hyperlipidemia, unspecified: Secondary | ICD-10-CM | POA: Diagnosis not present

## 2018-05-15 DIAGNOSIS — Z139 Encounter for screening, unspecified: Secondary | ICD-10-CM | POA: Diagnosis not present

## 2018-05-15 DIAGNOSIS — Z9181 History of falling: Secondary | ICD-10-CM | POA: Diagnosis not present

## 2018-08-29 DIAGNOSIS — J019 Acute sinusitis, unspecified: Secondary | ICD-10-CM | POA: Diagnosis not present

## 2018-09-05 ENCOUNTER — Other Ambulatory Visit: Payer: Self-pay | Admitting: Cardiovascular Disease

## 2018-09-25 DIAGNOSIS — I2510 Atherosclerotic heart disease of native coronary artery without angina pectoris: Secondary | ICD-10-CM | POA: Diagnosis not present

## 2018-09-25 DIAGNOSIS — E785 Hyperlipidemia, unspecified: Secondary | ICD-10-CM | POA: Diagnosis not present

## 2018-09-25 DIAGNOSIS — I10 Essential (primary) hypertension: Secondary | ICD-10-CM | POA: Diagnosis not present

## 2018-09-25 DIAGNOSIS — D509 Iron deficiency anemia, unspecified: Secondary | ICD-10-CM | POA: Diagnosis not present

## 2018-09-25 DIAGNOSIS — E039 Hypothyroidism, unspecified: Secondary | ICD-10-CM | POA: Diagnosis not present

## 2018-09-25 DIAGNOSIS — E1129 Type 2 diabetes mellitus with other diabetic kidney complication: Secondary | ICD-10-CM | POA: Diagnosis not present

## 2018-10-17 DIAGNOSIS — H40013 Open angle with borderline findings, low risk, bilateral: Secondary | ICD-10-CM | POA: Diagnosis not present

## 2019-03-16 DIAGNOSIS — N390 Urinary tract infection, site not specified: Secondary | ICD-10-CM | POA: Diagnosis not present

## 2019-03-18 ENCOUNTER — Observation Stay (HOSPITAL_COMMUNITY)
Admission: EM | Admit: 2019-03-18 | Discharge: 2019-03-19 | Disposition: A | Discharge status: Home / Self Care | Payer: Medicare Other | Attending: Family Medicine | Admitting: Family Medicine

## 2019-03-18 ENCOUNTER — Observation Stay (HOSPITAL_COMMUNITY): Payer: Medicare Other

## 2019-03-18 ENCOUNTER — Encounter (HOSPITAL_COMMUNITY): Payer: Self-pay

## 2019-03-18 ENCOUNTER — Other Ambulatory Visit: Payer: Self-pay

## 2019-03-18 DIAGNOSIS — R531 Weakness: Secondary | ICD-10-CM

## 2019-03-18 DIAGNOSIS — R410 Disorientation, unspecified: Secondary | ICD-10-CM | POA: Diagnosis not present

## 2019-03-18 DIAGNOSIS — I252 Old myocardial infarction: Secondary | ICD-10-CM | POA: Insufficient documentation

## 2019-03-18 DIAGNOSIS — E119 Type 2 diabetes mellitus without complications: Secondary | ICD-10-CM | POA: Diagnosis not present

## 2019-03-18 DIAGNOSIS — I4891 Unspecified atrial fibrillation: Secondary | ICD-10-CM

## 2019-03-18 DIAGNOSIS — E785 Hyperlipidemia, unspecified: Secondary | ICD-10-CM

## 2019-03-18 DIAGNOSIS — I700 Atherosclerosis of aorta: Secondary | ICD-10-CM | POA: Insufficient documentation

## 2019-03-18 DIAGNOSIS — Z885 Allergy status to narcotic agent status: Secondary | ICD-10-CM | POA: Insufficient documentation

## 2019-03-18 DIAGNOSIS — I2510 Atherosclerotic heart disease of native coronary artery without angina pectoris: Secondary | ICD-10-CM | POA: Diagnosis not present

## 2019-03-18 DIAGNOSIS — Z9181 History of falling: Secondary | ICD-10-CM | POA: Diagnosis not present

## 2019-03-18 DIAGNOSIS — R2681 Unsteadiness on feet: Secondary | ICD-10-CM | POA: Diagnosis not present

## 2019-03-18 DIAGNOSIS — N390 Urinary tract infection, site not specified: Secondary | ICD-10-CM | POA: Insufficient documentation

## 2019-03-18 DIAGNOSIS — I6782 Cerebral ischemia: Secondary | ICD-10-CM | POA: Insufficient documentation

## 2019-03-18 DIAGNOSIS — R404 Transient alteration of awareness: Secondary | ICD-10-CM | POA: Diagnosis not present

## 2019-03-18 DIAGNOSIS — E11649 Type 2 diabetes mellitus with hypoglycemia without coma: Secondary | ICD-10-CM | POA: Diagnosis not present

## 2019-03-18 DIAGNOSIS — Z7902 Long term (current) use of antithrombotics/antiplatelets: Secondary | ICD-10-CM | POA: Diagnosis not present

## 2019-03-18 DIAGNOSIS — I480 Paroxysmal atrial fibrillation: Secondary | ICD-10-CM | POA: Insufficient documentation

## 2019-03-18 DIAGNOSIS — E039 Hypothyroidism, unspecified: Secondary | ICD-10-CM | POA: Insufficient documentation

## 2019-03-18 DIAGNOSIS — R4182 Altered mental status, unspecified: Secondary | ICD-10-CM

## 2019-03-18 DIAGNOSIS — I10 Essential (primary) hypertension: Secondary | ICD-10-CM | POA: Diagnosis not present

## 2019-03-18 DIAGNOSIS — E162 Hypoglycemia, unspecified: Secondary | ICD-10-CM

## 2019-03-18 DIAGNOSIS — Z20828 Contact with and (suspected) exposure to other viral communicable diseases: Secondary | ICD-10-CM | POA: Insufficient documentation

## 2019-03-18 DIAGNOSIS — R0902 Hypoxemia: Secondary | ICD-10-CM | POA: Diagnosis not present

## 2019-03-18 DIAGNOSIS — I440 Atrioventricular block, first degree: Secondary | ICD-10-CM | POA: Insufficient documentation

## 2019-03-18 DIAGNOSIS — Z79899 Other long term (current) drug therapy: Secondary | ICD-10-CM | POA: Insufficient documentation

## 2019-03-18 DIAGNOSIS — R001 Bradycardia, unspecified: Secondary | ICD-10-CM | POA: Diagnosis not present

## 2019-03-18 DIAGNOSIS — R9431 Abnormal electrocardiogram [ECG] [EKG]: Secondary | ICD-10-CM | POA: Diagnosis not present

## 2019-03-18 DIAGNOSIS — E161 Other hypoglycemia: Secondary | ICD-10-CM | POA: Diagnosis not present

## 2019-03-18 DIAGNOSIS — Z7984 Long term (current) use of oral hypoglycemic drugs: Secondary | ICD-10-CM | POA: Insufficient documentation

## 2019-03-18 DIAGNOSIS — N179 Acute kidney failure, unspecified: Secondary | ICD-10-CM | POA: Insufficient documentation

## 2019-03-18 DIAGNOSIS — Z8249 Family history of ischemic heart disease and other diseases of the circulatory system: Secondary | ICD-10-CM | POA: Diagnosis not present

## 2019-03-18 DIAGNOSIS — G9341 Metabolic encephalopathy: Principal | ICD-10-CM | POA: Insufficient documentation

## 2019-03-18 DIAGNOSIS — Z888 Allergy status to other drugs, medicaments and biological substances status: Secondary | ICD-10-CM | POA: Insufficient documentation

## 2019-03-18 DIAGNOSIS — Z7982 Long term (current) use of aspirin: Secondary | ICD-10-CM | POA: Diagnosis not present

## 2019-03-18 DIAGNOSIS — Z79890 Hormone replacement therapy (postmenopausal): Secondary | ICD-10-CM | POA: Diagnosis not present

## 2019-03-18 LAB — CBG MONITORING, ED
Glucose-Capillary: 122 mg/dL — ABNORMAL HIGH (ref 70–99)
Glucose-Capillary: 157 mg/dL — ABNORMAL HIGH (ref 70–99)
Glucose-Capillary: 43 mg/dL — CL (ref 70–99)

## 2019-03-18 LAB — CBC WITH DIFFERENTIAL/PLATELET
Abs Immature Granulocytes: 0.03 10*3/uL (ref 0.00–0.07)
Basophils Absolute: 0 10*3/uL (ref 0.0–0.1)
Basophils Relative: 0 %
Eosinophils Absolute: 0 10*3/uL (ref 0.0–0.5)
Eosinophils Relative: 1 %
HCT: 40.4 % (ref 36.0–46.0)
Hemoglobin: 13.3 g/dL (ref 12.0–15.0)
Immature Granulocytes: 1 %
Lymphocytes Relative: 20 %
Lymphs Abs: 1 10*3/uL (ref 0.7–4.0)
MCH: 32.8 pg (ref 26.0–34.0)
MCHC: 32.9 g/dL (ref 30.0–36.0)
MCV: 99.8 fL (ref 80.0–100.0)
Monocytes Absolute: 0.4 10*3/uL (ref 0.1–1.0)
Monocytes Relative: 7 %
Neutro Abs: 3.6 10*3/uL (ref 1.7–7.7)
Neutrophils Relative %: 71 %
Platelets: 206 10*3/uL (ref 150–400)
RBC: 4.05 MIL/uL (ref 3.87–5.11)
RDW: 16.4 % — ABNORMAL HIGH (ref 11.5–15.5)
WBC: 5.1 10*3/uL (ref 4.0–10.5)
nRBC: 0 % (ref 0.0–0.2)

## 2019-03-18 LAB — COMPREHENSIVE METABOLIC PANEL
ALT: 11 U/L (ref 0–44)
AST: 22 U/L (ref 15–41)
Albumin: 3.7 g/dL (ref 3.5–5.0)
Alkaline Phosphatase: 42 U/L (ref 38–126)
Anion gap: 12 (ref 5–15)
BUN: 12 mg/dL (ref 8–23)
CO2: 24 mmol/L (ref 22–32)
Calcium: 7.4 mg/dL — ABNORMAL LOW (ref 8.9–10.3)
Chloride: 104 mmol/L (ref 98–111)
Creatinine, Ser: 1.18 mg/dL — ABNORMAL HIGH (ref 0.44–1.00)
GFR calc Af Amer: 50 mL/min — ABNORMAL LOW (ref >60)
GFR calc non Af Amer: 44 mL/min — ABNORMAL LOW (ref >60)
Glucose, Bld: 90 mg/dL (ref 70–99)
Potassium: 3.6 mmol/L (ref 3.5–5.1)
Sodium: 140 mmol/L (ref 135–145)
Total Bilirubin: 0.5 mg/dL (ref 0.3–1.2)
Total Protein: 6.7 g/dL (ref 6.5–8.1)

## 2019-03-18 LAB — URINALYSIS, ROUTINE W REFLEX MICROSCOPIC
Bacteria, UA: NONE SEEN
Bilirubin Urine: NEGATIVE
Glucose, UA: 150 mg/dL — AB
Ketones, ur: NEGATIVE mg/dL
Leukocytes,Ua: NEGATIVE
Nitrite: NEGATIVE
Protein, ur: NEGATIVE mg/dL
Specific Gravity, Urine: 1.004 — ABNORMAL LOW (ref 1.005–1.030)
pH: 7 (ref 5.0–8.0)

## 2019-03-18 LAB — GLUCOSE, CAPILLARY
Glucose-Capillary: 190 mg/dL — ABNORMAL HIGH (ref 70–99)
Glucose-Capillary: 351 mg/dL — ABNORMAL HIGH (ref 70–99)

## 2019-03-18 LAB — HEMOGLOBIN A1C
Hgb A1c MFr Bld: 6 % — ABNORMAL HIGH (ref 4.8–5.6)
Mean Plasma Glucose: 125.5 mg/dL

## 2019-03-18 LAB — LIPASE, BLOOD: Lipase: 29 U/L (ref 11–51)

## 2019-03-18 LAB — T4, FREE: Free T4: 2.72 ng/dL — ABNORMAL HIGH (ref 0.61–1.12)

## 2019-03-18 LAB — SARS CORONAVIRUS 2 (TAT 6-24 HRS): SARS Coronavirus 2: NEGATIVE

## 2019-03-18 LAB — TSH: TSH: 0.051 u[IU]/mL — ABNORMAL LOW (ref 0.350–4.500)

## 2019-03-18 MED ORDER — INSULIN ASPART 100 UNIT/ML ~~LOC~~ SOLN
0.0000 [IU] | Freq: Three times a day (TID) | SUBCUTANEOUS | Status: DC
  Administered 2019-03-18 – 2019-03-19 (×2): 2 [IU] via SUBCUTANEOUS
  Filled 2019-03-18: qty 0.09

## 2019-03-18 MED ORDER — PRAVASTATIN SODIUM 20 MG PO TABS
20.0000 mg | ORAL_TABLET | Freq: Every day | ORAL | Status: DC
  Administered 2019-03-18: 20 mg via ORAL
  Filled 2019-03-18: qty 1

## 2019-03-18 MED ORDER — ONDANSETRON HCL 4 MG PO TABS: 4.0000 mg | ORAL_TABLET | Freq: Four times a day (QID) | ORAL | Status: DC | PRN

## 2019-03-18 MED ORDER — LEVOTHYROXINE SODIUM 125 MCG PO TABS: 125.0000 ug | ORAL_TABLET | Freq: Every day | ORAL | Status: DC

## 2019-03-18 MED ORDER — SODIUM CHLORIDE 0.9 % IV SOLN
1.0000 g | INTRAVENOUS | Status: DC
  Administered 2019-03-18: 1 g via INTRAVENOUS
  Filled 2019-03-18: qty 1
  Filled 2019-03-18: qty 10

## 2019-03-18 MED ORDER — FERROUS SULFATE 325 (65 FE) MG PO TABS
325.0000 mg | ORAL_TABLET | Freq: Three times a day (TID) | ORAL | Status: DC
  Administered 2019-03-18: 19:00:00 325 mg via ORAL
  Filled 2019-03-18: qty 1

## 2019-03-18 MED ORDER — ACETAMINOPHEN 325 MG PO TABS: 650.0000 mg | ORAL_TABLET | Freq: Four times a day (QID) | ORAL | Status: DC | PRN

## 2019-03-18 MED ORDER — CLOPIDOGREL BISULFATE 75 MG PO TABS
75.0000 mg | ORAL_TABLET | Freq: Every day | ORAL | Status: DC
  Administered 2019-03-18 – 2019-03-19 (×2): 75 mg via ORAL
  Filled 2019-03-18 (×2): qty 1

## 2019-03-18 MED ORDER — NITROGLYCERIN 0.4 MG SL SUBL: 0.4000 mg | SUBLINGUAL_TABLET | SUBLINGUAL | Status: DC | PRN

## 2019-03-18 MED ORDER — ASPIRIN 81 MG PO TABS: 81.0000 mg | ORAL_TABLET | Freq: Every day | ORAL | Status: DC

## 2019-03-18 MED ORDER — SODIUM CHLORIDE 0.9 % IV SOLN
INTRAVENOUS | Status: DC
  Administered 2019-03-18 – 2019-03-19 (×2): via INTRAVENOUS

## 2019-03-18 MED ORDER — SODIUM CHLORIDE 0.9 % IV BOLUS
1000.0000 mL | Freq: Once | INTRAVENOUS | Status: AC
  Administered 2019-03-18: 1000 mL via INTRAVENOUS

## 2019-03-18 MED ORDER — ONDANSETRON HCL 4 MG/2ML IJ SOLN: 4.0000 mg | Freq: Four times a day (QID) | INTRAMUSCULAR | Status: DC | PRN

## 2019-03-18 MED ORDER — AMLODIPINE BESYLATE 5 MG PO TABS
5.0000 mg | ORAL_TABLET | Freq: Every day | ORAL | Status: DC
  Administered 2019-03-18 – 2019-03-19 (×2): 5 mg via ORAL
  Filled 2019-03-18 (×2): qty 1

## 2019-03-18 MED ORDER — ENOXAPARIN SODIUM 30 MG/0.3ML ~~LOC~~ SOLN
30.0000 mg | SUBCUTANEOUS | Status: DC
  Administered 2019-03-18: 30 mg via SUBCUTANEOUS
  Filled 2019-03-18: qty 0.3

## 2019-03-18 MED ORDER — ACETAMINOPHEN 650 MG RE SUPP: 650.0000 mg | Freq: Four times a day (QID) | RECTAL | Status: DC | PRN

## 2019-03-18 MED ORDER — POLYETHYLENE GLYCOL 3350 17 G PO PACK: 17.0000 g | PACK | Freq: Every day | ORAL | Status: DC | PRN

## 2019-03-18 MED ORDER — DEXTROSE 50 % IV SOLN
1.0000 | Freq: Once | INTRAVENOUS | Status: AC
  Administered 2019-03-18: 50 mL via INTRAVENOUS
  Filled 2019-03-18: qty 50

## 2019-03-18 MED ORDER — ALBUTEROL SULFATE (2.5 MG/3ML) 0.083% IN NEBU: 2.5000 mg | INHALATION_SOLUTION | RESPIRATORY_TRACT | Status: DC | PRN

## 2019-03-18 MED ORDER — ENOXAPARIN SODIUM 40 MG/0.4ML ~~LOC~~ SOLN: 40.0000 mg | SUBCUTANEOUS | Status: DC

## 2019-03-18 MED ORDER — ASPIRIN EC 81 MG PO TBEC
81.0000 mg | DELAYED_RELEASE_TABLET | Freq: Every day | ORAL | Status: DC
  Administered 2019-03-18 – 2019-03-19 (×2): 81 mg via ORAL
  Filled 2019-03-18: qty 1

## 2019-03-18 NOTE — ED Notes (Signed)
Patient given apple juice

## 2019-03-18 NOTE — ED Notes (Signed)
Transport called to take patient upstairs 

## 2019-03-18 NOTE — Plan of Care (Signed)
Patient and spouse educated on the importance of safety. Call bell within reach. Bed alarm set

## 2019-03-18 NOTE — ED Notes (Signed)
Hospitalist at bedside 

## 2019-03-18 NOTE — ED Triage Notes (Signed)
Per EMS: Pt from home.  Pt is more altered this am than usually.  Pts CBG was 51.  EMS gave 25 g D10.  Last cbg 196 on arrival.  Pt takes glipizide for diabetes.  Pt dx with UTI on Monday.  Pt taking cipro. 20 gauge IV R hand.

## 2019-03-18 NOTE — ED Notes (Signed)
MD made aware of CBG. Will give D50 and food per MD.

## 2019-03-18 NOTE — ED Notes (Signed)
XR at bedside

## 2019-03-18 NOTE — ED Notes (Signed)
Patient transported to CT 

## 2019-03-18 NOTE — ED Provider Notes (Signed)
[facility] DEPT Provider Note   CSN: [id number] Arrival date & time: 03/18/19  1001     History   Chief Complaint Chief Complaint  Patient presents with  . Weakness    HPI Melissa Jackson is a 78 y.o. female.     The history is provided by the patient.  Weakness Severity:  Mild Onset quality:  Gradual Timing:  Intermittent Progression:  Waxing and waning Chronicity:  New Context: recent infection (on antibiotics for UTI, on cipro, dx two days ago. Decreased PO intake. More confused than normal this AM, CBG was 51 with EMS. Given dextrose, improved mental status now. Not on insulin. did not eat this morning )   Relieved by:  None tried Worsened by:  Nothing Associated symptoms: frequency and urgency   Associated symptoms: no abdominal pain, no arthralgias, no chest pain, no cough, no dysuria, no fever, no myalgias, no nausea, no seizures, no shortness of breath and no vomiting   Risk factors: coronary artery disease     Past Medical History:  Diagnosis Date  . Acute myocardial infarction, subendocardial infarction, initial episode of care ([place])   . Anemia   . Anticoagulated    ON PRADAXA  . Atrial fibrillation ([place])   . Coronary artery disease   . Diabetes mellitus with no complication ([place])   . Disease of tricuspid valve    MILD  . Fall   . First degree atrioventricular block   . Head ache   . High risk medication use   . History of total hysterectomy   . Hyperlipidemia    SEVERE  . Hypertension, essential   . Hypothyroidism   . Left atrial enlargement    MILD  . Mitral valvular disorder    MILD-MODERATE  . Old myocardial infarction    JAN 2008  . Takotsubo syndrome     There are no active problems to display for this patient.   Past Surgical History:  Procedure Laterality Date  . APPENDECTOMY    . COLONOSCOPY  03/28/2005   Moderate left colonic diverticulosis. Internal hemorrhoids.  . INCONTINENCE SURGERY    . NO  PAST SURGERIES    . TOTAL ABDOMINAL HYSTERECTOMY       OB History   No obstetric history on file.      Home Medications    Prior to Admission medications   Medication Sig Start Date End Date Taking? Authorizing Provider  alendronate (FOSAMAX) 70 MG tablet Take 70 mg by mouth once a week. Takes on fridays 11/05/14  Yes [provider]  amiodarone (PACERONE) 200 MG tablet Take 200 mg by mouth daily. 11/22/14  Yes [provider]  amLODipine (NORVASC) 5 MG tablet Take 5 mg by mouth daily. 10/29/14  Yes [provider]  aspirin 81 MG tablet Take 81 mg by mouth daily.   Yes [provider]  chlorthalidone (HYGROTON) 25 MG tablet Take 25 mg by mouth every morning. 11/09/14  Yes [provider]  ciprofloxacin (CIPRO) 250 MG tablet Take 250 mg by mouth 2 (two) times daily. 03/16/19  Yes [provider]  clopidogrel (PLAVIX) 75 MG tablet Take 75 mg by mouth daily.   Yes [provider]  ferrous sulfate 325 (65 FE) MG tablet Take 325 mg by mouth 3 (three) times daily with meals.   Yes [provider]  glipiZIDE (GLUCOTROL) 10 MG tablet Take 10 mg by mouth 2 (two) times daily. 01/01/19  Yes [provider]  levothyroxine (  SYNTHROID, LEVOTHROID) 125 MCG tablet Take 125 mcg by mouth daily before breakfast.   Yes [provider]  losartan (COZAAR) 25 MG tablet Take 1 tablet by mouth once daily 09/05/18  Yes Josue Hector, MD  metFORMIN (GLUCOPHAGE) 1000 MG tablet Take 1,000 mg by mouth 2 (two) times daily. 03/13/19  Yes [provider]  metoprolol tartrate (LOPRESSOR) 25 MG tablet Take 1 tablet (25 mg total) by mouth 2 (two) times daily. 11/27/16  Yes Josue Hector, MD  Multiple Vitamins-Minerals (MULTIVITAMIN WITH MINERALS) tablet Take 1 tablet by mouth daily.   Yes [provider]  nitroGLYCERIN (NITROSTAT) 0.4 MG SL tablet Place 0.4 mg under the tongue every 5 (five) minutes as needed for chest pain.    Yes [provider]  Omega 3 1000 MG CAPS Take 2 capsules by mouth 2 (two) times daily.   Yes [provider]  pravastatin (PRAVACHOL) 20 MG tablet as directed. 09/07/17  Yes [provider]  Red Yeast Rice 600 MG CAPS Take 1 capsule by mouth daily.    Yes [provider]    Family History Family History  Problem Relation Age of Onset  . Heart attack Father   . Alzheimer's disease Mother   . Diabetes Mellitus I Mother   . Colon cancer Neg Hx     Social History Social History   Tobacco Use  . Smoking status: Never Smoker  . Smokeless tobacco: Never Used  Substance Use Topics  . Alcohol use: No    Alcohol/week: 0.0 standard drinks  . Drug use: No     Allergies   Lisinopril, Onion, Codeine, Morphine and related, and Statins   Review of Systems Review of Systems  Constitutional: Negative for chills and fever.  HENT: Negative for ear pain and sore throat.   Eyes: Negative for pain and visual disturbance.  Respiratory: Negative for cough and shortness of breath.   Cardiovascular: Negative for chest pain and palpitations.  Gastrointestinal: Negative for abdominal pain, nausea and vomiting.  Genitourinary: Positive for frequency and urgency. Negative for difficulty urinating, dysuria and hematuria.  Musculoskeletal: Negative for arthralgias, back pain and myalgias.  Skin: Negative for color change and rash.  Neurological: Positive for weakness. Negative for seizures and syncope.  All other systems reviewed and are negative.    Physical Exam Updated Vital Signs BP (!) 165/70   Pulse (!) 52   Temp 97.8 F (36.6 C) (Oral)   Resp 19   Ht 5\' 4"  (1.626 m)   Wt 49.9 kg   LMP  (LMP Unknown)   SpO2 99%   BMI 18.88 kg/m   Physical Exam Vitals signs and nursing note reviewed.  Constitutional:      General: She is not in acute distress.    Appearance: She is well-developed. She is not ill-appearing.  HENT:     Head: Normocephalic and  atraumatic.     Nose: Nose normal.     Mouth/Throat:     Mouth: Mucous membranes are dry.  Eyes:     Extraocular Movements: Extraocular movements intact.     Conjunctiva/sclera: Conjunctivae normal.     Pupils: Pupils are equal, round, and reactive to light.  Neck:     Musculoskeletal: Normal range of motion and neck supple.  Cardiovascular:     Rate and Rhythm: Normal rate and regular rhythm.     Pulses: Normal pulses.     Heart sounds: Normal heart sounds. No murmur.  Pulmonary:  Effort: Pulmonary effort is normal. No respiratory distress.     Breath sounds: Normal breath sounds.  Abdominal:     General: Abdomen is flat. There is no distension.     Palpations: Abdomen is soft.     Tenderness: There is no abdominal tenderness.  Skin:    General: Skin is warm and dry.  Neurological:     General: No focal deficit present.     Mental Status: She is alert and oriented to person, place, and time.     Cranial Nerves: No cranial nerve deficit.     Sensory: No sensory deficit.     Motor: No weakness.     Comments: 5+/5 strength, normal sensation, no drift      ED Treatments / Results  Labs (all labs ordered are listed, but only abnormal results are displayed) Labs Reviewed  CBC WITH DIFFERENTIAL/PLATELET - Abnormal; Notable for the following components:      Result Value   RDW 16.4 (*)    All other components within normal limits  COMPREHENSIVE METABOLIC PANEL - Abnormal; Notable for the following components:   Creatinine, Ser 1.18 (*)    Calcium 7.4 (*)    GFR calc non Af Amer 44 (*)    GFR calc Af Amer 50 (*)    All other components within normal limits  URINALYSIS, ROUTINE W REFLEX MICROSCOPIC - Abnormal; Notable for the following components:   Color, Urine STRAW (*)    Specific Gravity, Urine 1.004 (*)    Glucose, UA 150 (*)    Hgb urine dipstick SMALL (*)    All other components within normal limits  CBG MONITORING, ED - Abnormal; Notable for the following  components:   Glucose-Capillary 122 (*)    All other components within normal limits  CBG MONITORING, ED - Abnormal; Notable for the following components:   Glucose-Capillary 43 (*)    All other components within normal limits  CBG MONITORING, ED - Abnormal; Notable for the following components:   Glucose-Capillary 157 (*)    All other components within normal limits  URINE CULTURE  SARS CORONAVIRUS 2 (TAT 6-24 HRS)  LIPASE, BLOOD    EKG EKG Interpretation  Date/Time:  Wednesday March 18 2019 10:33:30 EDT Ventricular Rate:  55 PR Interval:    QRS Duration: 97 QT Interval:  513 QTC Calculation: 491 R Axis:   31 Text Interpretation:  Sinus rhythm Probable anterior infarct, age indeterminate Confirmed by Lennice Sites [phone]) on 03/18/2019 10:36:26 AM   Radiology No results found.  Procedures .Critical Care Performed by: Lennice Sites, DO Authorized by: Lennice Sites, DO   Critical care provider statement:    Critical care time (minutes):  35   Critical care was necessary to treat or prevent imminent or life-threatening deterioration of the following conditions:  Metabolic crisis   Critical care was time spent personally by me on the following activities:  Blood draw for specimens, development of treatment plan with patient or surrogate, discussions with primary provider, evaluation of patient's response to treatment, examination of patient, obtaining history from patient or surrogate, ordering and performing treatments and interventions, ordering and review of laboratory studies, ordering and review of radiographic studies, pulse oximetry, re-evaluation of patient's condition and review of old charts   I assumed direction of critical care for this patient from another provider in my specialty: no     (including critical care time)  Medications Ordered in ED Medications  sodium chloride 0.9 % bolus 1,000 mL (1,000  mLs Intravenous New Bag/Given 03/18/19 1056)  dextrose  50 % solution 50 mL (50 mLs Intravenous Given 03/18/19 1338)     Initial Impression / Assessment and Plan / ED Course  I have reviewed the triage vital signs and the nursing notes.  Pertinent labs & imaging results that were available during my care of the patient were reviewed by me and considered in my medical decision making (see chart for details).     Melissa Jackson is an 78 year old female with history of CAD, A. fib, hypertension, high cholesterol, diabetes who presents to the ED with generalized weakness.  Patient with unremarkable vitals.  Maybe has some early dementia per family through EMS.  Slightly more weak this morning than normal.  Recently diagnosed with a urinary tract infection 2 days ago and on ciprofloxacin.  Patient denies any chest pain, shortness of breath, abdominal pain.  Continues to have some urinary frequency and urgency.  Blood sugar was 51 with EMS and now improved following D10.  Mentation per EMS is also better.  She is not on insulin.  She is neurologically intact.  Patient is overall pleasant.  She appears to have some dry mucous membranes on exam.  Possibly some dehydration.  Will evaluate for electrolyte abnormalities, dehydration, UTI.  Will reevaluate after IV fluids.  Denies any nausea. Possibly cipro side effect. No concern for stroke.   Patient with unremarkable urinalysis.  Electrolytes overall unremarkable.  Mild elevation in kidney function.  EKG shows sinus rhythm.  Overall lab work unremarkable.  Patient feels better after IV fluids.  However, upon repeat CBG blood sugar is 43 and patient given an amp of D50. Blood sugar improved. Concerned that patient may be accidentally taking more glipizide/metformin by accident as she does appear to have some memory issues per family.  Given multiple drops in blood sugar and on long-acting anti-glycemic, will admit for further observation.  Hemodynamically stable, K.  This chart was dictated using voice recognition  software.  Despite best efforts to proofread,  errors can occur which can change the documentation meaning.    Final Clinical Impressions(s) / ED Diagnoses   Final diagnoses:  Hypoglycemia  Generalized weakness    ED Discharge Orders    None       Lennice Sites, DO 03/18/19 1429

## 2019-03-18 NOTE — ED Notes (Signed)
ED TO INPATIENT HANDOFF REPORT  Name/Age/Gender Melissa Jackson 78 y.o. female  Code Status Advance Directive Documentation     Most Recent Value  Type of Advance Directive  Healthcare Power of Attorney, Living will  Pre-existing out of facility DNR order (yellow form or pink MOST form)  -  "MOST" Form in Place?  -      Home/SNF/Other Home  Chief Complaint altered mental status  Level of Care/Admitting Diagnosis ED Disposition    ED Disposition Condition [place]: [place] [100102]  Level of Care: Med-Surg [16]  Covid Evaluation: Person Under Investigation (PUI)  Diagnosis: Acute metabolic encephalopathy [id number]  Admitting Physician: Alma Friendly [id number]  Attending Physician: Alma Friendly [id number]  PT Class (Do Not Modify): Observation [104]  PT Acc Code (Do Not Modify): Observation [10022]       Medical History Past Medical History:  Diagnosis Date  . Acute myocardial infarction, subendocardial infarction, initial episode of care ([place])   . Anemia   . Anticoagulated    ON PRADAXA  . Atrial fibrillation ([place])   . Coronary artery disease   . Diabetes mellitus with no complication ([place])   . Disease of tricuspid valve    MILD  . Fall   . First degree atrioventricular block   . Head ache   . High risk medication use   . History of total hysterectomy   . Hyperlipidemia    SEVERE  . Hypertension, essential   . Hypothyroidism   . Left atrial enlargement    MILD  . Mitral valvular disorder    MILD-MODERATE  . Old myocardial infarction    JAN 2008  . Takotsubo syndrome     Allergies Allergies  Allergen Reactions  . Lisinopril     Distant ? cough  . Onion Other (See Comments)    Makes pt sick  . Codeine Diarrhea  . Morphine And Related Other (See Comments)    GI UPSET  . Statins Other (See Comments)    ARTHRALGIA    IV Location/Drains/Wounds Patient Lines/Drains/Airways Status    Active Line/Drains/Airways    Name:   Placement date:   Placement time:   Site:   Days:   Peripheral IV 03/18/19 Right Hand   03/18/19    -    Hand   less than 1          Labs/Imaging Results for orders placed or performed during the hospital encounter of 03/18/19 (from the past 48 hour(s))  Urinalysis, Routine w reflex microscopic     Status: Abnormal   Collection Time: 03/18/19 10:14 AM  Result Value Ref Range   Color, Urine STRAW (A) YELLOW   APPearance CLEAR CLEAR   Specific Gravity, Urine 1.004 (L) 1.005 - 1.030   pH 7.0 5.0 - 8.0   Glucose, UA 150 (A) NEGATIVE mg/dL   Hgb urine dipstick SMALL (A) NEGATIVE   Bilirubin Urine NEGATIVE NEGATIVE   Ketones, ur NEGATIVE NEGATIVE mg/dL   Protein, ur NEGATIVE NEGATIVE mg/dL   Nitrite NEGATIVE NEGATIVE   Leukocytes,Ua NEGATIVE NEGATIVE   RBC / HPF 0-5 0 - 5 RBC/hpf   WBC, UA 0-5 0 - 5 WBC/hpf   Bacteria, UA NONE SEEN NONE SEEN    Comment: Performed at [facility],  [address] [place], [place] [address]  CBG monitoring, ED     Status: Abnormal   Collection Time: 03/18/19 10:16 AM  Result Value Ref Range  Glucose-Capillary 122 (H) 70 - 99 mg/dL  CBC with Differential     Status: Abnormal   Collection Time: 03/18/19 11:55 AM  Result Value Ref Range   WBC 5.1 4.0 - 10.5 K/uL   RBC 4.05 3.87 - 5.11 MIL/uL   Hemoglobin 13.3 12.0 - 15.0 g/dL   HCT 40.4 36.0 - 46.0 %   MCV 99.8 80.0 - 100.0 fL   MCH 32.8 26.0 - 34.0 pg   MCHC 32.9 30.0 - 36.0 g/dL   RDW 16.4 (H) 11.5 - 15.5 %   Platelets 206 150 - 400 K/uL   nRBC 0.0 0.0 - 0.2 %   Neutrophils Relative % 71 %   Neutro Abs 3.6 1.7 - 7.7 K/uL   Lymphocytes Relative 20 %   Lymphs Abs 1.0 0.7 - 4.0 K/uL   Monocytes Relative 7 %   Monocytes Absolute 0.4 0.1 - 1.0 K/uL   Eosinophils Relative 1 %   Eosinophils Absolute 0.0 0.0 - 0.5 K/uL   Basophils Relative 0 %   Basophils Absolute 0.0 0.0 - 0.1 K/uL   Immature Granulocytes 1 %   Abs Immature  Granulocytes 0.03 0.00 - 0.07 K/uL    Comment: Performed at [facility], [place] [address], [place], [place] [address]  Comprehensive metabolic panel     Status: Abnormal   Collection Time: 03/18/19 11:55 AM  Result Value Ref Range   Sodium 140 135 - 145 mmol/L   Potassium 3.6 3.5 - 5.1 mmol/L   Chloride 104 98 - 111 mmol/L   CO2 24 22 - 32 mmol/L   Glucose, Bld 90 70 - 99 mg/dL   BUN 12 8 - 23 mg/dL   Creatinine, Ser 1.18 (H) 0.44 - 1.00 mg/dL   Calcium 7.4 (L) 8.9 - 10.3 mg/dL   Total Protein 6.7 6.5 - 8.1 g/dL   Albumin 3.7 3.5 - 5.0 g/dL   AST 22 15 - 41 U/L   ALT 11 0 - 44 U/L   Alkaline Phosphatase 42 38 - 126 U/L   Total Bilirubin 0.5 0.3 - 1.2 mg/dL   GFR calc non Af Amer 44 (L) >60 mL/min   GFR calc Af Amer 50 (L) >60 mL/min   Anion gap 12 5 - 15    Comment: Performed at [facility], [place] [address] [place], [place] [address]  Lipase, blood     Status: None   Collection Time: 03/18/19 11:55 AM  Result Value Ref Range   Lipase 29 11 - 51 U/L    Comment: Performed at [facility], [place] [address] [place],  [address]  TSH     Status: Abnormal   Collection Time: 03/18/19 12:04 PM  Result Value Ref Range   TSH 0.051 (L) 0.350 - 4.500 uIU/mL    Comment: Performed by a 3rd Generation assay with a functional sensitivity of <=0.01 uIU/mL. Performed at [facility], [place] [address] [place],  [address]   POC CBG, ED     Status: Abnormal   Collection Time: 03/18/19  1:30 PM  Result Value Ref Range   Glucose-Capillary 43 (LL) 70 - 99 mg/dL   Comment 1 Notify RN    Comment 2 Document in Chart    Comment 3 Call MD NNP PA CNM   CBG monitoring, ED     Status: Abnormal   Collection Time: 03/18/19  2:04 PM  Result Value Ref Range   Glucose-Capillary 157 (H) 70 -  99 mg/dL   Comment 1 Notify RN    Ct Head Wo Contrast  Result Date: 03/18/2019 CLINICAL DATA:  "Pt from home. Pt is more altered  this am than usually. Pts CBG was 51. EMS gave 25 g D10. Last cbg 196 on arrival. Pt takes glipizide for diabetes. Pt dx with UTI on Monday. Pt taking cipro." EXAM: CT HEAD WITHOUT CONTRAST TECHNIQUE: Contiguous axial images were obtained from the base of the skull through the vertex without intravenous contrast. COMPARISON:  11/29/2011 FINDINGS: Brain: No evidence of acute infarction, hemorrhage, hydrocephalus, extra-axial collection or mass lesion/mass effect. There is age appropriate ventricular and sulcal enlargement. Old lacunar infarct noted in the right basal ganglia. Bilateral patchy white matter hypoattenuation is present consistent with mild chronic microvascular ischemic change. Vascular: No hyperdense vessel or unexpected calcification. Skull: Normal. Negative for fracture or focal lesion. Sinuses/Orbits: Globes and orbits are unremarkable. Visualized sinuses and mastoid air cells are clear. Other: None. IMPRESSION: 1. No acute intracranial abnormalities. 2. Age related volume loss. Mild chronic microvascular ischemic change. Electronically Signed   By: Lajean Manes M.D.   On: 03/18/2019 16:17   Dg Chest Port 1 View  Result Date: 03/18/2019 CLINICAL DATA:  78 year old female with history of altered mental status. EXAM: PORTABLE CHEST 1 VIEW COMPARISON:  Chest x-ray 02/21/2013. FINDINGS: Lung volumes are normal. No consolidative airspace disease. No pleural effusions. No pneumothorax. No pulmonary nodule or mass noted. Pulmonary vasculature and the cardiomediastinal silhouette are within normal limits. Atherosclerosis in the thoracic aorta. IMPRESSION: 1.  No radiographic evidence of acute cardiopulmonary disease. 2. Aortic atherosclerosis. Electronically Signed   By: Vinnie Langton M.D.   On: 03/18/2019 15:52    Pending Labs Unresulted Labs (From admission, onward)    Start     Ordered   03/18/19 1535  T4, free  Once,   STAT     03/18/19 1534   03/18/19 1506  Hemoglobin A1c  Once,   STAT     Comments: To assess prior glycemic control    03/18/19 1506   03/18/19 1444  Culture, blood (routine x 2)  BLOOD CULTURE X 2,   R     03/18/19 1443   03/18/19 1401  SARS CORONAVIRUS 2 (TAT 6-24 HRS) Nasopharyngeal Nasopharyngeal Swab  (Asymptomatic/Tier 2 Patients Labs)  Once,   STAT    Question Answer Comment  Is this test for diagnosis or screening Screening   Symptomatic for COVID-19 as defined by CDC No   Hospitalized for COVID-19 No   Admitted to ICU for COVID-19 No   Previously tested for COVID-19 No   Resident in a congregate (group) care setting No   Employed in healthcare setting No   Pregnant No      03/18/19 1401   03/18/19 1014  Urine culture  ONCE - STAT,   STAT     03/18/19 1013   Signed and Held  Basic metabolic panel  Tomorrow morning,   R     Signed and Held   Signed and Held  CBC  Tomorrow morning,   R     Signed and Held          Vitals/Pain Today's Vitals   03/18/19 1332 03/18/19 1500 03/18/19 1508 03/18/19 1530  BP: (!) 165/70 (!) 159/63  110/90  Pulse: (!) 52 (!) 59 (!) 59 60  Resp: 19 18 16 19   Temp:      TempSrc:      SpO2: 99% 99% 97% 97%  Weight:      Height:        Isolation Precautions No active isolations  Medications Medications  0.9 %  sodium chloride infusion (has no administration in time range)  cefTRIAXone (ROCEPHIN) 1 g in sodium chloride 0.9 % 100 mL IVPB (has no administration in time range)  insulin aspart (novoLOG) injection 0-9 Units (has no administration in time range)  amLODipine (NORVASC) tablet 5 mg (has no administration in time range)  aspirin tablet 81 mg (has no administration in time range)  clopidogrel (PLAVIX) tablet 75 mg (has no administration in time range)  ferrous sulfate tablet 325 mg (has no administration in time range)  levothyroxine (SYNTHROID) tablet 125 mcg (has no administration in time range)  nitroGLYCERIN (NITROSTAT) SL tablet 0.4 mg (has no administration in time range)  pravastatin  (PRAVACHOL) tablet 20 mg (has no administration in time range)  sodium chloride 0.9 % bolus 1,000 mL (0 mLs Intravenous Stopped 03/18/19 1435)  dextrose 50 % solution 50 mL (50 mLs Intravenous Given 03/18/19 1338)    Mobility walks with device

## 2019-03-18 NOTE — H&P (Addendum)
History and Physical  Melissa Jackson [id number] DOB: 1941/04/15 DOA: 03/18/2019   Patient coming from: Home & is able to ambulate  Chief Complaint: Confusion  HPI: Melissa Jackson is a 78 y.o. female with medical history significant for hypertension, hyperlipidemia, A. fib, diabetes mellitus type 2, hypothyroidism presents to the ED due to confusion, and some weakness.  Husband at bedside who provided most of the history.  Husband reported about 3 days ago patient noted to be weak and intermittently confused.  Patient was treated for possible UTI and started on p.o. ciprofloxacin on 03/16/2019, which patient acknowledged she had been taking.  Husband noted that due to confusion patient may have not taking her meds appropriately.  Husband called EMS, who noted her CBG was 51 and was given dextrose.  In the ED, patient denies any chest pain, abdominal pain, nausea/vomiting, diarrhea, shortness of breath, dysuria.  ED Course: Patient noted to be afebrile, bradycardic, hypertensive, labs show mildly elevated creatinine, otherwise unremarkable.  UA negative for infection, CT head unremarkable, chest x-ray unremarkable.  Patient admitted for further management  Review of Systems: Review of systems are otherwise negative   Past Medical History:  Diagnosis Date  . Acute myocardial infarction, subendocardial infarction, initial episode of care ([place])   . Anemia   . Anticoagulated    ON PRADAXA  . Atrial fibrillation ([place])   . Coronary artery disease   . Diabetes mellitus with no complication ([place])   . Disease of tricuspid valve    MILD  . Fall   . First degree atrioventricular block   . Head ache   . High risk medication use   . History of total hysterectomy   . Hyperlipidemia    SEVERE  . Hypertension, essential   . Hypothyroidism   . Left atrial enlargement    MILD  . Mitral valvular disorder    MILD-MODERATE  . Old myocardial infarction    JAN 2008  . Takotsubo syndrome     Past Surgical History:  Procedure Laterality Date  . APPENDECTOMY    . COLONOSCOPY  03/28/2005   Moderate left colonic diverticulosis. Internal hemorrhoids.  . INCONTINENCE SURGERY    . NO PAST SURGERIES    . TOTAL ABDOMINAL HYSTERECTOMY      Social History:  reports that she has never smoked. She has never used smokeless tobacco. She reports that she does not drink alcohol or use drugs.   Allergies  Allergen Reactions  . Lisinopril     Distant ? cough  . Onion Other (See Comments)    Makes pt sick  . Codeine Diarrhea  . Morphine And Related Other (See Comments)    GI UPSET  . Statins Other (See Comments)    ARTHRALGIA    Family History  Problem Relation Age of Onset  . Heart attack Father   . Alzheimer's disease Mother   . Diabetes Mellitus I Mother   . Colon cancer Neg Hx       Prior to Admission medications   Medication Sig Start Date End Date Taking? Authorizing Provider  alendronate (FOSAMAX) 70 MG tablet Take 70 mg by mouth once a week. Takes on fridays 11/05/14  Yes [provider]  amiodarone (PACERONE) 200 MG tablet Take 200 mg by mouth daily. 11/22/14  Yes [provider]  amLODipine (NORVASC) 5 MG tablet Take 5 mg by mouth daily. 10/29/14  Yes [provider]  aspirin 81 MG tablet Take 81 mg by mouth daily.   Yes  [provider]  chlorthalidone (HYGROTON) 25 MG tablet Take 25 mg by mouth every morning. 11/09/14  Yes [provider]  ciprofloxacin (CIPRO) 250 MG tablet Take 250 mg by mouth 2 (two) times daily. 03/16/19  Yes [provider]  clopidogrel (PLAVIX) 75 MG tablet Take 75 mg by mouth daily.   Yes [provider]  ferrous sulfate 325 (65 FE) MG tablet Take 325 mg by mouth 3 (three) times daily with meals.   Yes [provider]  glipiZIDE (GLUCOTROL) 10 MG tablet Take 10 mg by mouth 2 (two) times daily. 01/01/19  Yes [provider]  levothyroxine (SYNTHROID, LEVOTHROID) 125  MCG tablet Take 125 mcg by mouth daily before breakfast.   Yes [provider]  losartan (COZAAR) 25 MG tablet Take 1 tablet by mouth once daily 09/05/18  Yes Josue Hector, MD  metFORMIN (GLUCOPHAGE) 1000 MG tablet Take 1,000 mg by mouth 2 (two) times daily. 03/13/19  Yes [provider]  metoprolol tartrate (LOPRESSOR) 25 MG tablet Take 1 tablet (25 mg total) by mouth 2 (two) times daily. 11/27/16  Yes Josue Hector, MD  Multiple Vitamins-Minerals (MULTIVITAMIN WITH MINERALS) tablet Take 1 tablet by mouth daily.   Yes [provider]  nitroGLYCERIN (NITROSTAT) 0.4 MG SL tablet Place 0.4 mg under the tongue every 5 (five) minutes as needed for chest pain.   Yes [provider]  Omega 3 1000 MG CAPS Take 2 capsules by mouth 2 (two) times daily.   Yes [provider]  pravastatin (PRAVACHOL) 20 MG tablet as directed. 09/07/17  Yes [provider]  Red Yeast Rice 600 MG CAPS Take 1 capsule by mouth daily.    Yes [provider]    Physical Exam: BP (!) 172/61 (BP Location: Right Arm)   Pulse 60   Temp 99 F (37.2 C) (Oral)   Resp 18   Ht 5\' 4"  (1.626 m)   Wt 49.2 kg   LMP  (LMP Unknown)   SpO2 100%   BMI 18.62 kg/m   General: NAD, cachectic, chronically ill-appearing, alert, awake, oriented to person, time and not to place Eyes: Normal ENT: Normal Neck: Supple Cardiovascular: S1, S2 present Respiratory: CTA B Abdomen: Soft, non-distended, nontender, bowel sounds present Skin: Normal Musculoskeletal: No pedal edema bilaterally Psychiatric: Normal mood Neurologic: No obvious focal neurologic deficit noted          Labs on Admission:  Basic Metabolic Panel: Recent Labs  Lab 03/18/19 1155  NA 140  K 3.6  CL 104  CO2 24  GLUCOSE 90  BUN 12  CREATININE 1.18*  CALCIUM 7.4*   Liver Function Tests: Recent Labs  Lab 03/18/19 1155  AST 22  ALT 11  ALKPHOS 42  BILITOT 0.5  PROT 6.7  ALBUMIN 3.7   Recent Labs   Lab 03/18/19 1155  LIPASE 29   No results for input(s): AMMONIA in the last 168 hours. CBC: Recent Labs  Lab 03/18/19 1155  WBC 5.1  NEUTROABS 3.6  HGB 13.3  HCT 40.4  MCV 99.8  PLT 206   Cardiac Enzymes: No results for input(s): CKTOTAL, CKMB, CKMBINDEX, TROPONINI in the last 168 hours.  BNP (last 3 results) No results for input(s): BNP in the last 8760 hours.  ProBNP (last 3 results) No results for input(s): PROBNP in the last 8760 hours.  CBG: Recent Labs  Lab 03/18/19 1016 03/18/19 1330 03/18/19 1404 03/18/19 1729  GLUCAP 122* 43* 157* 190*  Radiological Exams on Admission: Ct Head Wo Contrast  Result Date: 03/18/2019 CLINICAL DATA:  "Pt from home. Pt is more altered this am than usually. Pts CBG was 51. EMS gave 25 g D10. Last cbg 196 on arrival. Pt takes glipizide for diabetes. Pt dx with UTI on Monday. Pt taking cipro." EXAM: CT HEAD WITHOUT CONTRAST TECHNIQUE: Contiguous axial images were obtained from the base of the skull through the vertex without intravenous contrast. COMPARISON:  11/29/2011 FINDINGS: Brain: No evidence of acute infarction, hemorrhage, hydrocephalus, extra-axial collection or mass lesion/mass effect. There is age appropriate ventricular and sulcal enlargement. Old lacunar infarct noted in the right basal ganglia. Bilateral patchy white matter hypoattenuation is present consistent with mild chronic microvascular ischemic change. Vascular: No hyperdense vessel or unexpected calcification. Skull: Normal. Negative for fracture or focal lesion. Sinuses/Orbits: Globes and orbits are unremarkable. Visualized sinuses and mastoid air cells are clear. Other: None. IMPRESSION: 1. No acute intracranial abnormalities. 2. Age related volume loss. Mild chronic microvascular ischemic change. Electronically Signed   By: Lajean Manes M.D.   On: 03/18/2019 16:17   Dg Chest Port 1 View  Result Date: 03/18/2019 CLINICAL DATA:  78 year old female with history  of altered mental status. EXAM: PORTABLE CHEST 1 VIEW COMPARISON:  Chest x-ray 02/21/2013. FINDINGS: Lung volumes are normal. No consolidative airspace disease. No pleural effusions. No pneumothorax. No pulmonary nodule or mass noted. Pulmonary vasculature and the cardiomediastinal silhouette are within normal limits. Atherosclerosis in the thoracic aorta. IMPRESSION: 1.  No radiographic evidence of acute cardiopulmonary disease. 2. Aortic atherosclerosis. Electronically Signed   By: Vinnie Langton M.D.   On: 03/18/2019 15:52    EKG: Independently reviewed.  Normal sinus rhythm, with no acute ST changes, prolonged QTC  Assessment/Plan Present on Admission: . Acute metabolic encephalopathy  Principal Problem:   Acute metabolic encephalopathy Active Problems:   Atrial fibrillation (HCC)   Diabetes mellitus with no complication (HCC)   Hypertension, essential   Hyperlipidemia   Hypothyroidism  Acute metabolic encephalopathy Husband reported patient has been more confused over the past 3 days Likely 2/2 UTI in addition to hypoglycemia (on metformin/glipizide at home, might have taken more than normal) Currently afebrile, with no leukocytosis UA unremarkable, has been on Cipro for about 2 days UC pending BC x2 pending Chest x-ray unremarkable CT head unremarkable Continue IV ceftriaxone for now, pending UC IV fluids Monitor closely  Hypoglycemia with history of diabetes mellitus type 2 A1c pending Patient on metformin/glipizide at home, will hold SSI, Accu-Cheks, hypoglycemic protocol Monitor closely  Mild AKI Creatinine 1.18 We will hold home chlorthalidone, losartan IV fluids Daily BMP  Low TSH/history of hypothyroidism TSH 0.051 Free T4 pending Currently on Synthroid, 125 mcg, will hold for now, pending free T4 for possible adjustment  Hypertension/mild bradycardia BP uncontrolled, HR in the 50s Continue home amlodipine, hold home amiodarone, metoprolol for  now Monitor closely and adjust accordingly  History of paroxysmal A. Fib Sinus rhythm, rate controlled Held home amiodarone metoprolol due to mild bradycardia, please reevaluate in the a.m. Not on any anticoagulation  CAD Currently chest pain-free Continue Plavix, pravastatin  QTC prolongation Avoid QTC prolonging medications     DVT prophylaxis: Lovenox  Code Status: Full  Family Communication: Husband at bedside  Disposition Plan: To be determined  Consults called: None  Admission status: Observation    Alma Friendly MD [facility]   If 7PM-7AM, please contact night-coverage   03/18/2019, 6:15 PM

## 2019-03-19 DIAGNOSIS — G9341 Metabolic encephalopathy: Secondary | ICD-10-CM

## 2019-03-19 LAB — BASIC METABOLIC PANEL
Anion gap: 11 (ref 5–15)
BUN: 11 mg/dL (ref 8–23)
CO2: 22 mmol/L (ref 22–32)
Calcium: 7 mg/dL — ABNORMAL LOW (ref 8.9–10.3)
Chloride: 105 mmol/L (ref 98–111)
Creatinine, Ser: 1.11 mg/dL — ABNORMAL HIGH (ref 0.44–1.00)
GFR calc Af Amer: 54 mL/min — ABNORMAL LOW (ref >60)
GFR calc non Af Amer: 47 mL/min — ABNORMAL LOW (ref >60)
Glucose, Bld: 159 mg/dL — ABNORMAL HIGH (ref 70–99)
Potassium: 3.4 mmol/L — ABNORMAL LOW (ref 3.5–5.1)
Sodium: 138 mmol/L (ref 135–145)

## 2019-03-19 LAB — URINE CULTURE: Culture: NO GROWTH

## 2019-03-19 LAB — CBC
HCT: 35.7 % — ABNORMAL LOW (ref 36.0–46.0)
Hemoglobin: 11.6 g/dL — ABNORMAL LOW (ref 12.0–15.0)
MCH: 32.1 pg (ref 26.0–34.0)
MCHC: 32.5 g/dL (ref 30.0–36.0)
MCV: 98.9 fL (ref 80.0–100.0)
Platelets: 189 10*3/uL (ref 150–400)
RBC: 3.61 MIL/uL — ABNORMAL LOW (ref 3.87–5.11)
RDW: 16.5 % — ABNORMAL HIGH (ref 11.5–15.5)
WBC: 5.2 10*3/uL (ref 4.0–10.5)
nRBC: 0 % (ref 0.0–0.2)

## 2019-03-19 LAB — GLUCOSE, CAPILLARY
Glucose-Capillary: 101 mg/dL — ABNORMAL HIGH (ref 70–99)
Glucose-Capillary: 186 mg/dL — ABNORMAL HIGH (ref 70–99)

## 2019-03-19 MED ORDER — LEVOTHYROXINE SODIUM 112 MCG PO TABS: 112.0000 ug | ORAL_TABLET | Freq: Every day | ORAL | 0 refills | Status: DC

## 2019-03-19 MED ORDER — CEFDINIR 300 MG PO CAPS: 300.0000 mg | ORAL_CAPSULE | Freq: Two times a day (BID) | ORAL | 0 refills | Status: AC

## 2019-03-19 MED ORDER — SODIUM CHLORIDE 0.9 % IV SOLN
1.0000 g | Freq: Once | INTRAVENOUS | Status: AC
  Administered 2019-03-19: 1 g via INTRAVENOUS
  Filled 2019-03-19: qty 1

## 2019-03-19 MED ORDER — METOPROLOL TARTRATE 25 MG PO TABS: 12.5000 mg | ORAL_TABLET | Freq: Two times a day (BID) | ORAL | 0 refills | Status: DC

## 2019-03-19 NOTE — Evaluation (Signed)
Occupational Therapy Evaluation Patient Details Name: Melissa Jackson MRN: [id number] DOB: 07-19-1940 Today's Date: 03/19/2019    History of Present Illness 78 y.o. female with medical history significant for hypertension, hyperlipidemia, A. fib, diabetes mellitus type 2, hypothyroidism presents to the ED due to confusion and admitted for acute metabolic encephalopathy likely due to UTI   Clinical Impression   Pt admitted with the above diagnoses and presents with below problem list. Pt will benefit from continued acute OT to address the below listed deficits and maximize independence with basic ADLs prior to d/c home. At baseline, pt is independent with ADLs though she does endorse furniture walking ie reaching out for external support as she is walking around her home. Pt currently mod I to supervision with ADLs. Spouse present and involved in education. Pt awaiting d/c home today.      Follow Up Recommendations  Home health OT;Supervision - Intermittent(OOB/mobility)    Equipment Recommendations  None recommended by OT    Recommendations for Other Services       Precautions / Restrictions Precautions Precautions: Fall Restrictions Weight Bearing Restrictions: No      Mobility Bed Mobility Overal bed mobility: Modified Independent                Transfers Overall transfer level: Needs assistance Equipment used: None Transfers: Sit to/from Stand Sit to Stand: Supervision;Min guard         General transfer comment: min/guard initially for safety    Balance Overall balance assessment: History of Falls                                         ADL either performed or assessed with clinical judgement   ADL Overall ADL's : Modified independent                                       General ADL Comments: Pt noted to seek single extremity support while walking in the room. Pt prefers walker to cane. Discussed DME.     Vision          Perception     Praxis      Pertinent Vitals/Pain Pain Assessment: No/denies pain     Hand Dominance     Extremity/Trunk Assessment Upper Extremity Assessment Upper Extremity Assessment: Overall WFL for tasks assessed   Lower Extremity Assessment Lower Extremity Assessment: Defer to PT evaluation       Communication Communication Communication: No difficulties   Cognition Arousal/Alertness: Awake/alert Behavior During Therapy: WFL for tasks assessed/performed Overall Cognitive Status: Within Functional Limits for tasks assessed                                     General Comments  Spouse present throughout session    Exercises     Shoulder Instructions      Home Living Family/patient expects to be discharged to:: Private residence Living Arrangements: Spouse/significant other Available Help at Discharge: Family Type of Home: House Home Access: Stairs to enter Technical brewer of Steps: 1   Home Layout: Able to live on main level with bedroom/bathroom     Bathroom Shower/Tub: Teacher, early years/pre: Standard     Home Equipment: Environmental consultant - 2  wheels;Bedside commode;Cane - quad          Prior Functioning/Environment Level of Independence: Independent        Comments: Pt sponge bathes at baseline; never gets in the shower due to fear of falling. Educated on tub bench DME should pt want to pursue full shower use at some point.        OT Problem List: Decreased strength;Decreased activity tolerance;Impaired balance (sitting and/or standing);Decreased knowledge of precautions;Decreased knowledge of use of DME or AE      OT Treatment/Interventions: Self-care/ADL training;Therapeutic exercise;Energy conservation;DME and/or AE instruction;Patient/family education;Balance training;Therapeutic activities    OT Goals(Current goals can be found in the care plan section) Acute Rehab OT Goals Patient Stated Goal: home, not  fall  OT Goal Formulation: With patient/family Time For Goal Achievement: 04/02/19 Potential to Achieve Goals: Good ADL Goals Pt Will Perform Grooming: with modified independence;standing Pt Will Perform Lower Body Bathing: with modified independence;sit to/from stand Pt Will Perform Lower Body Dressing: with modified independence;sit to/from stand Pt Will Transfer to Toilet: with modified independence;ambulating Pt Will Perform Toileting - Clothing Manipulation and hygiene: with modified independence;sit to/from stand  OT Frequency: Min 2X/week   Barriers to D/C:            Co-evaluation              AM-PAC OT "6 Clicks" Daily Activity     Outcome Measure Help from another person eating meals?: None Help from another person taking care of personal grooming?: None Help from another person toileting, which includes using toliet, bedpan, or urinal?: None Help from another person bathing (including washing, rinsing, drying)?: A Little Help from another person to put on and taking off regular upper body clothing?: None Help from another person to put on and taking off regular lower body clothing?: A Little 6 Click Score: 22   End of Session Equipment Utilized During Treatment: Rolling walker(vs none)  Activity Tolerance: Patient tolerated treatment well Patient left: in bed;with call bell/phone within reach;with bed alarm set;with family/visitor present  OT Visit Diagnosis: Unsteadiness on feet (R26.81);History of falling (Z91.81)                Time: [id number] OT Time Calculation (min): 13 min Charges:  OT General Charges $OT Visit: 1 Visit OT Evaluation $OT Eval Low Complexity: [place], OT Acute Rehabilitation Services Pager: [phone] Office: [phone]   Hortencia Pilar 03/19/2019, 1:26 PM

## 2019-03-19 NOTE — Evaluation (Signed)
Physical Therapy One Time Evaluation Patient Details Name: Melissa Jackson MRN: [id number] DOB: Feb 12, 1941 Today's Date: 03/19/2019   History of Present Illness  78 y.o. female with medical history significant for hypertension, hyperlipidemia, A. fib, diabetes mellitus type 2, hypothyroidism presents to the ED due to confusion and admitted for acute metabolic encephalopathy likely due to UTI  Clinical Impression  Patient evaluated by Physical Therapy with no further acute PT needs identified. All education has been completed and the patient has no further questions.  Pt mobilizing good distance today with RW however did not really need for support.  Pt reports multiple falls at home due to "tripping" on things.  Recommend HHPT and home safety evaluation to decrease risk of falls at home. Pt likely to d/c home today.  PT is signing off. Thank you for this referral.      Follow Up Recommendations Home health PT(specifically home safety evaluation)    Equipment Recommendations  None recommended by PT    Recommendations for Other Services       Precautions / Restrictions Precautions Precautions: Fall      Mobility  Bed Mobility Overal bed mobility: Modified Independent                Transfers Overall transfer level: Needs assistance Equipment used: None Transfers: Sit to/from Stand Sit to Stand: Supervision;Min guard         General transfer comment: min/guard initially for safety  Ambulation/Gait Ambulation/Gait assistance: Min guard;Supervision Gait Distance (Feet): 160 Feet Assistive device: Rolling walker (2 wheeled) Gait Pattern/deviations: Step-through pattern;Decreased stride length     General Gait Details: utilized RW for safety however pt not really needing for support or weight bearing on RW  Stairs            Wheelchair Mobility    Modified Rankin (Stroke Patients Only)       Balance Overall balance assessment: History of Falls                                            Pertinent Vitals/Pain Pain Assessment: No/denies pain    Home Living Family/patient expects to be discharged to:: Private residence Living Arrangements: Spouse/significant other Available Help at Discharge: Family Type of Home: House Home Access: Stairs to enter   Technical brewer of Steps: 1 Home Layout: Able to live on main level with bedroom/bathroom Home Equipment: Environmental consultant - 2 wheels      Prior Function Level of Independence: Independent               Hand Dominance        Extremity/Trunk Assessment        Lower Extremity Assessment Lower Extremity Assessment: Overall WFL for tasks assessed       Communication   Communication: No difficulties  Cognition Arousal/Alertness: Awake/alert Behavior During Therapy: WFL for tasks assessed/performed Overall Cognitive Status: Within Functional Limits for tasks assessed                                        General Comments      Exercises     Assessment/Plan    PT Assessment All further PT needs can be met in the next venue of care  PT Problem List Decreased mobility;Decreased strength;Decreased balance;Decreased knowledge of use  of DME       PT Treatment Interventions      PT Goals (Current goals can be found in the Care Plan section)  Acute Rehab PT Goals PT Goal Formulation: All assessment and education complete, DC therapy    Frequency     Barriers to discharge        Co-evaluation               AM-PAC PT "6 Clicks" Mobility  Outcome Measure Help needed turning from your back to your side while in a flat bed without using bedrails?: None Help needed moving from lying on your back to sitting on the side of a flat bed without using bedrails?: None Help needed moving to and from a bed to a chair (including a wheelchair)?: A Little Help needed standing up from a chair using your arms (e.g., wheelchair or bedside  chair)?: A Little Help needed to walk in hospital room?: A Little Help needed climbing 3-5 steps with a railing? : A Little 6 Click Score: 20    End of Session Equipment Utilized During Treatment: Gait belt Activity Tolerance: Patient tolerated treatment well Patient left: in chair;with call bell/phone within reach;with chair alarm set Nurse Communication: Mobility status PT Visit Diagnosis: History of falling (Z91.81)    Time: 7001-7494 PT Time Calculation (min) (ACUTE ONLY): 14 min   Charges:   PT Evaluation $PT Eval Low Complexity: [place], PT, DPT Acute Rehabilitation Services Office: [phone] Pager: [phone]  Trena Platt 03/19/2019, 12:03 PM

## 2019-03-19 NOTE — Progress Notes (Signed)
Went over discharge papers with patient and family.  All questions answered.  VSS.  PT wheeled out. HH needs set up.

## 2019-03-19 NOTE — TOC Initial Note (Signed)
Transition of Care [facility]) - Initial/Assessment Note    Patient Details  Name: Melissa Jackson MRN: [id number] Date of Birth: 1940-11-14  [facility] CM/SW Contact:    Purcell Mouton, RN Phone Number: 03/19/2019, 2:53 PM  Clinical Narrative:                 Spoke with pt's husband. [facility] is okay for HHPT.   Expected Discharge Plan: [place]     Patient Goals and CMS Choice Patient states their goals for this hospitalization and ongoing recovery are:: Home when stable.   Choice offered to / list presented to : Spouse  Expected Discharge Plan and Services Expected Discharge Plan: [employer]   Discharge Planning Services: CM Consult   Living arrangements for the past 2 months: Single Family Home Expected Discharge Date: 03/19/19                         HH Arranged: PT HH Agency: [place] Date [place]: 03/19/19 Time [place]: [place] Representative spoke with at [place] Arrangements/Services Living arrangements for the past 2 months: [place] with:: Spouse Patient language and need for interpreter reviewed:: No Do you feel safe going back to the place where you live?: Yes      Need for Family Participation in Patient Care: Yes (Comment) Care giver support system in place?: Yes (comment) Current home services: Home PT    Activities of Daily Living Home Assistive Devices/Equipment: Cane (specify quad or straight), Walker (specify type), Wheelchair, Eyeglasses, CBG Meter(standard walker-no wheels, quad cane) ADL Screening (condition at time of admission) Patient's cognitive ability adequate to safely complete daily activities?: Yes Is the patient deaf or have difficulty hearing?: No Does the patient have difficulty seeing, even when wearing glasses/contacts?: No Does the patient have difficulty concentrating,  remembering, or making decisions?: No Patient able to express need for assistance with ADLs?: Yes Does the patient have difficulty dressing or bathing?: No Independently performs ADLs?: No Communication: Independent Dressing (OT): Independent Grooming: Independent Feeding: Independent Bathing: Independent Toileting: Independent In/Out Bed: Independent Walks in Home: Independent Does the patient have difficulty walking or climbing stairs?: Yes(secondary to weakness) Weakness of Legs: Both Weakness of Arms/Hands: None  Permission Sought/Granted Permission sought to share information with : Case Manager                Emotional Assessment Appearance:: Appears stated age     Orientation: : Oriented to Self, Oriented to Place, Oriented to  Time, Oriented to Situation      Admission diagnosis:  Hypoglycemia [E16.2] Generalized weakness [R53.1] Altered mental status, unspecified [R41.82] Patient Active Problem List   Diagnosis Date Noted  . Acute metabolic encephalopathy [id number]  . Atrial fibrillation ([place])   . Diabetes mellitus with no complication ([place])   . Hypertension, essential   . Hyperlipidemia   . Hypothyroidism    PCP:  [name], MD Pharmacy:   [facility] [address], [place] [id number] [place] [id number] Phone: [phone] Fax: [phone]     Social Determinants of Health (SDOH) Interventions    Readmission Risk Interventions No flowsheet data found.

## 2019-03-19 NOTE — Discharge Summary (Signed)
Physician Discharge Summary  Melissa Jackson [id number] DOB: 12-13-1940 DOA: 03/18/2019  PCP: [name], MD  Admit date: 03/18/2019 Discharge date: 03/19/2019  Admitted From: Home Disposition: Home  Recommendations for Outpatient Follow-up:  1. Follow up with PCP in 1 week 2. Glipizide discontinued. Readdress diabetes management as needed 3. Metoprolol decreased to 12.5 mg BID dosing secondary to bradycardia 4. Synthroid dose decreased to 112 mcg secondary to low TSH and elevated free T4.  Recommend repeat thyroid labs in 4 to 6 weeks 5. Please follow up on the following pending results: None  Home Health: PT Equipment/Devices: None  Discharge Condition: Stable CODE STATUS: Full code Diet recommendation: Heart healthy, carb modified  Brief/Interim Summary:  Admission HPI written by Alma Friendly, MD   Chief Complaint: Confusion  HPI: Melissa Jackson is a 78 y.o. female with medical history significant for hypertension, hyperlipidemia, A. fib, diabetes mellitus type 2, hypothyroidism presents to the ED due to confusion, and some weakness.  Husband at bedside who provided most of the history.  Husband reported about 3 days ago patient noted to be weak and intermittently confused.  Patient was treated for possible UTI and started on p.o. ciprofloxacin on 03/16/2019, which patient acknowledged she had been taking.  Husband noted that due to confusion patient may have not taking her meds appropriately.  Husband called EMS, who noted her CBG was 51 and was given dextrose.  In the ED, patient denies any chest pain, abdominal pain, nausea/vomiting, diarrhea, shortness of breath, dysuria.  ED Course: Patient noted to be afebrile, bradycardic, hypertensive, labs show mildly elevated creatinine, otherwise unremarkable.  UA negative for infection, CT head unremarkable, chest x-ray unremarkable.  Patient admitted for further management    Hospital  course:  Confusion This appears to possibly be multifactorial with recent UTI, recent initiation of ciprofloxacin, hypoglycemia on admission.  Patient was empirically treated with ceftriaxone for UTI overnight.  Ciprofloxacin discontinued.  Patient treated for hypoglycemia.  Confusion resolved overnight.  Patient back to baseline per patient and per patient's husband.  Physical therapy consulted and recommended home health physical therapy.  Hypoglycemia Patient with a hemoglobin A1c of 6.0%.  Patient required D50 for hypoglycemia with significant improvement in blood sugar.  She maintained euglycemia and was more on the hyperglycemic side.  Recommending to discontinue glipizide on discharge.  Continue Metformin and diet modification.  Recommend non-hypoglycemic alternative treatments to be considered.  Kidney insufficiency Unknown if this is chronic or acute.  Has remained stable.  Follow-up with PCP.  Hypothyroidism Patient's TSH was 0.051, which is low.  Free T4 was elevated at 2.72.  Decrease Synthroid dose to 112 mcg daily.  Recommend outpatient follow-up on labs.  Essential hypertension Significant elevated blood pressure.  Patient's antihypertensives were held on admission.  Will resume most at home doses.  Metoprolol decreased to 12.5 mg twice daily secondary to bradycardia.  Sinus bradycardia Mild and insetting of metoprolol.  Dose decreased to 12.5 mg twice daily on discharge.  Paroxysmal atrial fibrillation Currently in sinus rhythm.  Continue home amiodarone and decrease dose of metoprolol 12.5 mg twice daily.  CAD Continue home Plavix and pravastatin  QTC prolongation Likely complicated by recent ciprofloxacin use.  Recommend repeat EKG as an outpatient.   Discharge Diagnoses:  Principal Problem:   Acute metabolic encephalopathy Active Problems:   Atrial fibrillation (HCC)   Diabetes mellitus with no complication (HCC)   Hypertension, essential   Hyperlipidemia    Hypothyroidism    Discharge  Instructions  Discharge Instructions    Diet - low sodium heart healthy   Complete by: As directed    Increase activity slowly   Complete by: As directed      Allergies as of 03/19/2019      Reactions   Lisinopril    Distant ? cough   Onion Other (See Comments)   Makes pt sick   Codeine Diarrhea   Morphine And Related Other (See Comments)   GI UPSET   Statins Other (See Comments)   ARTHRALGIA      Medication List    STOP taking these medications   ciprofloxacin 250 MG tablet Commonly known as: CIPRO   glipiZIDE 10 MG tablet Commonly known as: GLUCOTROL     TAKE these medications   alendronate 70 MG tablet Commonly known as: FOSAMAX Take 70 mg by mouth once a week. Takes on fridays   amiodarone 200 MG tablet Commonly known as: PACERONE Take 200 mg by mouth daily.   amLODipine 5 MG tablet Commonly known as: NORVASC Take 5 mg by mouth daily.   aspirin 81 MG tablet Take 81 mg by mouth daily.   cefdinir 300 MG capsule Commonly known as: OMNICEF Take 1 capsule (300 mg total) by mouth 2 (two) times daily for 1 day. Start taking on: March 20, 2019   chlorthalidone 25 MG tablet Commonly known as: HYGROTON Take 25 mg by mouth every morning.   clopidogrel 75 MG tablet Commonly known as: PLAVIX Take 75 mg by mouth daily.   ferrous sulfate 325 (65 FE) MG tablet Take 325 mg by mouth 3 (three) times daily with meals. Notes to patient: Today with meals   levothyroxine 112 MCG tablet Commonly known as: SYNTHROID Take 1 tablet (112 mcg total) by mouth daily before breakfast. Start taking on: March 20, 2019 What changed:   medication strength  how much to take   losartan 25 MG tablet Commonly known as: COZAAR Take 1 tablet by mouth once daily   metFORMIN 1000 MG tablet Commonly known as: GLUCOPHAGE Take 1,000 mg by mouth 2 (two) times daily.   metoprolol tartrate 25 MG tablet Commonly known as: LOPRESSOR Take 0.5  tablets (12.5 mg total) by mouth 2 (two) times daily. What changed: how much to take Notes to patient: Tonight before bed   multivitamin with minerals tablet Take 1 tablet by mouth daily.   nitroGLYCERIN 0.4 MG SL tablet Commonly known as: NITROSTAT Place 0.4 mg under the tongue every 5 (five) minutes as needed for chest pain.   Omega 3 1000 MG Caps Take 2 capsules by mouth 2 (two) times daily.   pravastatin 20 MG tablet Commonly known as: PRAVACHOL as directed.   Red Yeast Rice 600 MG Caps Take 1 capsule by mouth daily.       Allergies  Allergen Reactions  . Lisinopril     Distant ? cough  . Onion Other (See Comments)    Makes pt sick  . Codeine Diarrhea  . Morphine And Related Other (See Comments)    GI UPSET  . Statins Other (See Comments)    ARTHRALGIA    Consultations:  None   Procedures/Studies: Ct Head Wo Contrast  Result Date: 03/18/2019 CLINICAL DATA:  "Pt from home. Pt is more altered this am than usually. Pts CBG was 51. EMS gave 25 g D10. Last cbg 196 on arrival. Pt takes glipizide for diabetes. Pt dx with UTI on Monday. Pt taking cipro." EXAM: CT HEAD WITHOUT CONTRAST  TECHNIQUE: Contiguous axial images were obtained from the base of the skull through the vertex without intravenous contrast. COMPARISON:  11/29/2011 FINDINGS: Brain: No evidence of acute infarction, hemorrhage, hydrocephalus, extra-axial collection or mass lesion/mass effect. There is age appropriate ventricular and sulcal enlargement. Old lacunar infarct noted in the right basal ganglia. Bilateral patchy white matter hypoattenuation is present consistent with mild chronic microvascular ischemic change. Vascular: No hyperdense vessel or unexpected calcification. Skull: Normal. Negative for fracture or focal lesion. Sinuses/Orbits: Globes and orbits are unremarkable. Visualized sinuses and mastoid air cells are clear. Other: None. IMPRESSION: 1. No acute intracranial abnormalities. 2. Age  related volume loss. Mild chronic microvascular ischemic change. Electronically Signed   By: Lajean Manes M.D.   On: 03/18/2019 16:17   Dg Chest Port 1 View  Result Date: 03/18/2019 CLINICAL DATA:  78 year old female with history of altered mental status. EXAM: PORTABLE CHEST 1 VIEW COMPARISON:  Chest x-ray 02/21/2013. FINDINGS: Lung volumes are normal. No consolidative airspace disease. No pleural effusions. No pneumothorax. No pulmonary nodule or mass noted. Pulmonary vasculature and the cardiomediastinal silhouette are within normal limits. Atherosclerosis in the thoracic aorta. IMPRESSION: 1.  No radiographic evidence of acute cardiopulmonary disease. 2. Aortic atherosclerosis. Electronically Signed   By: Vinnie Langton M.D.   On: 03/18/2019 15:52      Subjective: Patient reports no issues today.  No dysuria, frequency, hesitancy.  No hematuria.  Husband states that patient is back to baseline.  Discharge Exam: Vitals:   03/19/19 0902 03/19/19 1326  BP: (!) 143/61 (!) 146/60  Pulse:  (!) 53  Resp:  18  Temp:  98.2 F (36.8 C)  SpO2:  100%   Vitals:   03/18/19 2101 03/19/19 0539 03/19/19 0902 03/19/19 1326  BP: (!) 145/55 (!) 176/62 (!) 143/61 (!) 146/60  Pulse: 65 (!) 52  (!) 53  Resp: 16 16  18   Temp: 98.2 F (36.8 C) 97.6 F (36.4 C)  98.2 F (36.8 C)  TempSrc: Oral Oral  Oral  SpO2: 98% 100%  100%  Weight:      Height:        General: Pt is alert, awake, not in acute distress Cardiovascular: RRR, S1/S2 +, no rubs, no gallops Respiratory: CTA bilaterally, no wheezing, no rhonchi Abdominal: Soft, NT, ND, bowel sounds + Extremities: no edema, no cyanosis Neurologic alert and oriented to person place and time.    The results of significant diagnostics from this hospitalization (including imaging, microbiology, ancillary and laboratory) are listed below for reference.     Microbiology: Recent Results (from the past 240 hour(s))  Urine culture     Status: None    Collection Time: 03/18/19 10:14 AM   Specimen: Urine, Clean Catch  Result Value Ref Range Status   Specimen Description   Final    URINE, CLEAN CATCH Performed at [facility], [place] [address], [place], [place] [address]    Special Requests   Final    NONE Performed at [facility], [place] [address], [place], [place] [address]    Culture   Final    NO GROWTH Performed at [facility], [place] [address], [place], [place] [address]    Report Status 03/19/2019 FINAL  Final  SARS CORONAVIRUS 2 (TAT 6-24 HRS) Nasopharyngeal Nasopharyngeal Swab     Status: None   Collection Time: 03/18/19  4:08 PM   Specimen: Nasopharyngeal Swab  Result Value Ref Range Status   SARS Coronavirus 2 NEGATIVE  NEGATIVE Final    Comment: (NOTE) SARS-CoV-2 target nucleic acids are NOT DETECTED. The SARS-CoV-2 RNA is generally detectable in upper and lower respiratory specimens during the acute phase of infection. Negative results do not preclude SARS-CoV-2 infection, do not rule out co-infections with other pathogens, and should not be used as the sole basis for treatment or other patient management decisions. Negative results must be combined with clinical observations, patient history, and epidemiological information. The expected result is Negative. Fact Sheet for Patients: SugarRoll.be Fact Sheet for Healthcare Providers: https://www.woods-mathews.com/ This test is not yet approved or cleared by the [place] FDA and  has been authorized for detection and/or diagnosis of SARS-CoV-2 by FDA under an Emergency Use Authorization (EUA). This EUA will remain  in effect (meaning this test can be used) for the duration of the COVID-19 declaration under Section 56 4(b)(1) of the Act, 21 U.S.C. section 360bbb-3(b)(1), unless the authorization is terminated or revoked sooner. Performed at [facility], [place] [address] [place], [place] [address]   Culture, blood (routine x 2)     Status: None (Preliminary result)   Collection Time: 03/18/19  5:33 PM   Specimen: BLOOD  Result Value Ref Range Status   Specimen Description   Final    BLOOD LAC Performed at [place] [address] [place], [place] [address]    Special Requests   Final    BOTTLES DRAWN AEROBIC ONLY Blood Culture adequate volume Performed at [facility] [address], [place], [place] [address]    Culture   Final    NO GROWTH < 12 HOURS Performed at [place] [address] [place], [place] [address]    Report Status PENDING  Incomplete  Culture, blood (routine x 2)     Status: None (Preliminary result)   Collection Time: 03/18/19  5:33 PM   Specimen: BLOOD LEFT HAND  Result Value Ref Range Status   Specimen Description   Final    BLOOD LEFT HAND Performed at [facility] [address], [place], [place] [address]    Special Requests   Final    BOTTLES DRAWN AEROBIC ONLY Blood Culture results may not be optimal due to an inadequate volume of blood received in culture bottles Performed at [place] [address] [place], [place] [address]    Culture   Final    NO GROWTH < 12 HOURS Performed at [facility] [address] [place],  [address]    Report Status PENDING  Incomplete     Labs: BNP (last 3 results) No results for input(s): BNP in the last 8760 hours. Basic Metabolic Panel: Recent Labs  Lab 03/18/19 1155 03/19/19 0423  NA 140 138  K 3.6 3.4*  CL 104 105  CO2 24 22  GLUCOSE 90 159*  BUN 12 11  CREATININE 1.18* 1.11*  CALCIUM 7.4* 7.0*   Liver Function Tests: Recent Labs  Lab 03/18/19 1155  AST 22  ALT 11  ALKPHOS 42  BILITOT 0.5  PROT 6.7  ALBUMIN 3.7   Recent Labs  Lab 03/18/19 1155  LIPASE 29   No results for input(s): AMMONIA in the last 168 hours. CBC: Recent Labs  Lab  03/18/19 1155 03/19/19 0423  WBC 5.1 5.2  NEUTROABS 3.6  --   HGB 13.3 11.6*  HCT 40.4 35.7*  MCV 99.8 98.9  PLT 206 189   Cardiac Enzymes: No  results for input(s): CKTOTAL, CKMB, CKMBINDEX, TROPONINI in the last 168 hours. BNP: Invalid input(s): POCBNP CBG: Recent Labs  Lab 03/18/19 1404 03/18/19 1729 03/18/19 2059 03/19/19 0738 03/19/19 1149  GLUCAP 157* 190* 351* 101* 186*   D-Dimer No results for input(s): DDIMER in the last 72 hours. Hgb A1c Recent Labs    03/18/19 1204  HGBA1C 6.0*   Lipid Profile No results for input(s): CHOL, HDL, LDLCALC, TRIG, CHOLHDL, LDLDIRECT in the last 72 hours. Thyroid function studies Recent Labs    03/18/19 1204  TSH 0.051*   Anemia work up No results for input(s): VITAMINB12, FOLATE, FERRITIN, TIBC, IRON, RETICCTPCT in the last 72 hours. Urinalysis    Component Value Date/Time   COLORURINE STRAW (A) 03/18/2019 1014   APPEARANCEUR CLEAR 03/18/2019 1014   LABSPEC 1.004 (L) 03/18/2019 1014   PHURINE 7.0 03/18/2019 1014   GLUCOSEU 150 (A) 03/18/2019 1014   HGBUR SMALL (A) 03/18/2019 1014   BILIRUBINUR NEGATIVE 03/18/2019 1014   KETONESUR NEGATIVE 03/18/2019 1014   PROTEINUR NEGATIVE 03/18/2019 1014   NITRITE NEGATIVE 03/18/2019 1014   LEUKOCYTESUR NEGATIVE 03/18/2019 1014   Sepsis Labs Invalid input(s): PROCALCITONIN,  WBC,  LACTICIDVEN Microbiology Recent Results (from the past 240 hour(s))  Urine culture     Status: None   Collection Time: 03/18/19 10:14 AM   Specimen: Urine, Clean Catch  Result Value Ref Range Status   Specimen Description   Final    URINE, CLEAN CATCH Performed at [facility], [place] [address] [place], [place] [address]    Special Requests   Final    NONE Performed at [facility], [place] [address], [place], [place] [address]    Culture   Final    NO GROWTH Performed at [facility], [place] [address] [place], [place] [address]    Report Status  03/19/2019 FINAL  Final  SARS CORONAVIRUS 2 (TAT 6-24 HRS) Nasopharyngeal Nasopharyngeal Swab     Status: None   Collection Time: 03/18/19  4:08 PM   Specimen: Nasopharyngeal Swab  Result Value Ref Range Status   SARS Coronavirus 2 NEGATIVE NEGATIVE Final    Comment: (NOTE) SARS-CoV-2 target nucleic acids are NOT DETECTED. The SARS-CoV-2 RNA is generally detectable in upper and lower respiratory specimens during the acute phase of infection. Negative results do not preclude SARS-CoV-2 infection, do not rule out co-infections with other pathogens, and should not be used as the sole basis for treatment or other patient management decisions. Negative results must be combined with clinical observations, patient history, and epidemiological information. The expected result is Negative. Fact Sheet for Patients: SugarRoll.be Fact Sheet for Healthcare Providers: https://www.woods-mathews.com/ This test is not yet approved or cleared by the [place] FDA and  has been authorized for detection and/or diagnosis of SARS-CoV-2 by FDA under an Emergency Use Authorization (EUA). This EUA will remain  in effect (meaning this test can be used) for the duration of the COVID-19 declaration under Section 56 4(b)(1) of the Act, 21 U.S.C. section 360bbb-3(b)(1), unless the authorization is terminated or revoked sooner. Performed at [facility], [place] [address], [place], [place] [address]   Culture, blood (routine x 2)     Status: None (Preliminary result)   Collection Time: 03/18/19  5:33 PM   Specimen: BLOOD  Result Value Ref Range Status   Specimen Description   Final    BLOOD LAC Performed at [facility] [address] [place], [place] [address]  Special Requests   Final    BOTTLES DRAWN AEROBIC ONLY Blood Culture adequate volume Performed at [facility] [address] [place], [place] [address]     Culture   Final    NO GROWTH < 12 HOURS Performed at [facility] [address], [place], [place] [address]    Report Status PENDING  Incomplete  Culture, blood (routine x 2)     Status: None (Preliminary result)   Collection Time: 03/18/19  5:33 PM   Specimen: BLOOD LEFT HAND  Result Value Ref Range Status   Specimen Description   Final    BLOOD LEFT HAND Performed at [facility] [address], [place], [place] [address]    Special Requests   Final    BOTTLES DRAWN AEROBIC ONLY Blood Culture results may not be optimal due to an inadequate volume of blood received in culture bottles Performed at [place] [place] [address], [place], [place] [address]    Culture   Final    NO GROWTH < 12 HOURS Performed at [facility] [place] [address] [place], [place] [address]    Report Status PENDING  Incomplete     SIGNED:   Cordelia Poche, MD [facility] 03/19/2019, 1:34 PM

## 2019-03-21 DIAGNOSIS — Z7984 Long term (current) use of oral hypoglycemic drugs: Secondary | ICD-10-CM | POA: Diagnosis not present

## 2019-03-21 DIAGNOSIS — I440 Atrioventricular block, first degree: Secondary | ICD-10-CM | POA: Diagnosis not present

## 2019-03-21 DIAGNOSIS — I10 Essential (primary) hypertension: Secondary | ICD-10-CM | POA: Diagnosis not present

## 2019-03-21 DIAGNOSIS — Z9181 History of falling: Secondary | ICD-10-CM | POA: Diagnosis not present

## 2019-03-21 DIAGNOSIS — I480 Paroxysmal atrial fibrillation: Secondary | ICD-10-CM | POA: Diagnosis not present

## 2019-03-21 DIAGNOSIS — N289 Disorder of kidney and ureter, unspecified: Secondary | ICD-10-CM | POA: Diagnosis not present

## 2019-03-21 DIAGNOSIS — R2681 Unsteadiness on feet: Secondary | ICD-10-CM | POA: Diagnosis not present

## 2019-03-21 DIAGNOSIS — N390 Urinary tract infection, site not specified: Secondary | ICD-10-CM | POA: Diagnosis not present

## 2019-03-21 DIAGNOSIS — R001 Bradycardia, unspecified: Secondary | ICD-10-CM | POA: Diagnosis not present

## 2019-03-21 DIAGNOSIS — G9341 Metabolic encephalopathy: Secondary | ICD-10-CM | POA: Diagnosis not present

## 2019-03-21 DIAGNOSIS — E119 Type 2 diabetes mellitus without complications: Secondary | ICD-10-CM | POA: Diagnosis not present

## 2019-03-21 DIAGNOSIS — Z7982 Long term (current) use of aspirin: Secondary | ICD-10-CM | POA: Diagnosis not present

## 2019-03-21 DIAGNOSIS — Z7902 Long term (current) use of antithrombotics/antiplatelets: Secondary | ICD-10-CM | POA: Diagnosis not present

## 2019-03-21 DIAGNOSIS — I071 Rheumatic tricuspid insufficiency: Secondary | ICD-10-CM | POA: Diagnosis not present

## 2019-03-21 DIAGNOSIS — I252 Old myocardial infarction: Secondary | ICD-10-CM | POA: Diagnosis not present

## 2019-03-21 DIAGNOSIS — E785 Hyperlipidemia, unspecified: Secondary | ICD-10-CM | POA: Diagnosis not present

## 2019-03-21 DIAGNOSIS — I2510 Atherosclerotic heart disease of native coronary artery without angina pectoris: Secondary | ICD-10-CM | POA: Diagnosis not present

## 2019-03-21 DIAGNOSIS — Z7901 Long term (current) use of anticoagulants: Secondary | ICD-10-CM | POA: Diagnosis not present

## 2019-03-21 DIAGNOSIS — E039 Hypothyroidism, unspecified: Secondary | ICD-10-CM | POA: Diagnosis not present

## 2019-03-23 LAB — CULTURE, BLOOD (ROUTINE X 2)
Culture: NO GROWTH
Culture: NO GROWTH
Special Requests: ADEQUATE

## 2019-03-24 DIAGNOSIS — R001 Bradycardia, unspecified: Secondary | ICD-10-CM | POA: Diagnosis not present

## 2019-03-24 DIAGNOSIS — Z9181 History of falling: Secondary | ICD-10-CM | POA: Diagnosis not present

## 2019-03-24 DIAGNOSIS — E039 Hypothyroidism, unspecified: Secondary | ICD-10-CM | POA: Diagnosis not present

## 2019-03-24 DIAGNOSIS — Z7901 Long term (current) use of anticoagulants: Secondary | ICD-10-CM | POA: Diagnosis not present

## 2019-03-24 DIAGNOSIS — E119 Type 2 diabetes mellitus without complications: Secondary | ICD-10-CM | POA: Diagnosis not present

## 2019-03-24 DIAGNOSIS — I440 Atrioventricular block, first degree: Secondary | ICD-10-CM | POA: Diagnosis not present

## 2019-03-24 DIAGNOSIS — R2681 Unsteadiness on feet: Secondary | ICD-10-CM | POA: Diagnosis not present

## 2019-03-24 DIAGNOSIS — N390 Urinary tract infection, site not specified: Secondary | ICD-10-CM | POA: Diagnosis not present

## 2019-03-24 DIAGNOSIS — I252 Old myocardial infarction: Secondary | ICD-10-CM | POA: Diagnosis not present

## 2019-03-24 DIAGNOSIS — E785 Hyperlipidemia, unspecified: Secondary | ICD-10-CM | POA: Diagnosis not present

## 2019-03-24 DIAGNOSIS — I10 Essential (primary) hypertension: Secondary | ICD-10-CM | POA: Diagnosis not present

## 2019-03-24 DIAGNOSIS — I071 Rheumatic tricuspid insufficiency: Secondary | ICD-10-CM | POA: Diagnosis not present

## 2019-03-24 DIAGNOSIS — G9341 Metabolic encephalopathy: Secondary | ICD-10-CM | POA: Diagnosis not present

## 2019-03-24 DIAGNOSIS — Z7982 Long term (current) use of aspirin: Secondary | ICD-10-CM | POA: Diagnosis not present

## 2019-03-24 DIAGNOSIS — Z7902 Long term (current) use of antithrombotics/antiplatelets: Secondary | ICD-10-CM | POA: Diagnosis not present

## 2019-03-24 DIAGNOSIS — N289 Disorder of kidney and ureter, unspecified: Secondary | ICD-10-CM | POA: Diagnosis not present

## 2019-03-24 DIAGNOSIS — I2510 Atherosclerotic heart disease of native coronary artery without angina pectoris: Secondary | ICD-10-CM | POA: Diagnosis not present

## 2019-03-24 DIAGNOSIS — Z7984 Long term (current) use of oral hypoglycemic drugs: Secondary | ICD-10-CM | POA: Diagnosis not present

## 2019-03-24 DIAGNOSIS — I480 Paroxysmal atrial fibrillation: Secondary | ICD-10-CM | POA: Diagnosis not present

## 2019-03-25 DIAGNOSIS — Z9181 History of falling: Secondary | ICD-10-CM | POA: Diagnosis not present

## 2019-03-25 DIAGNOSIS — I440 Atrioventricular block, first degree: Secondary | ICD-10-CM | POA: Diagnosis not present

## 2019-03-25 DIAGNOSIS — Z7982 Long term (current) use of aspirin: Secondary | ICD-10-CM | POA: Diagnosis not present

## 2019-03-25 DIAGNOSIS — Z7984 Long term (current) use of oral hypoglycemic drugs: Secondary | ICD-10-CM | POA: Diagnosis not present

## 2019-03-25 DIAGNOSIS — R001 Bradycardia, unspecified: Secondary | ICD-10-CM | POA: Diagnosis not present

## 2019-03-25 DIAGNOSIS — R2681 Unsteadiness on feet: Secondary | ICD-10-CM | POA: Diagnosis not present

## 2019-03-25 DIAGNOSIS — I480 Paroxysmal atrial fibrillation: Secondary | ICD-10-CM | POA: Diagnosis not present

## 2019-03-25 DIAGNOSIS — E119 Type 2 diabetes mellitus without complications: Secondary | ICD-10-CM | POA: Diagnosis not present

## 2019-03-25 DIAGNOSIS — E039 Hypothyroidism, unspecified: Secondary | ICD-10-CM | POA: Diagnosis not present

## 2019-03-25 DIAGNOSIS — Z7902 Long term (current) use of antithrombotics/antiplatelets: Secondary | ICD-10-CM | POA: Diagnosis not present

## 2019-03-25 DIAGNOSIS — I252 Old myocardial infarction: Secondary | ICD-10-CM | POA: Diagnosis not present

## 2019-03-25 DIAGNOSIS — I071 Rheumatic tricuspid insufficiency: Secondary | ICD-10-CM | POA: Diagnosis not present

## 2019-03-25 DIAGNOSIS — N289 Disorder of kidney and ureter, unspecified: Secondary | ICD-10-CM | POA: Diagnosis not present

## 2019-03-25 DIAGNOSIS — I10 Essential (primary) hypertension: Secondary | ICD-10-CM | POA: Diagnosis not present

## 2019-03-25 DIAGNOSIS — Z7901 Long term (current) use of anticoagulants: Secondary | ICD-10-CM | POA: Diagnosis not present

## 2019-03-25 DIAGNOSIS — E785 Hyperlipidemia, unspecified: Secondary | ICD-10-CM | POA: Diagnosis not present

## 2019-03-25 DIAGNOSIS — G9341 Metabolic encephalopathy: Secondary | ICD-10-CM | POA: Diagnosis not present

## 2019-03-25 DIAGNOSIS — I2510 Atherosclerotic heart disease of native coronary artery without angina pectoris: Secondary | ICD-10-CM | POA: Diagnosis not present

## 2019-03-25 DIAGNOSIS — N390 Urinary tract infection, site not specified: Secondary | ICD-10-CM | POA: Diagnosis not present

## 2019-03-26 DIAGNOSIS — I252 Old myocardial infarction: Secondary | ICD-10-CM | POA: Diagnosis not present

## 2019-03-26 DIAGNOSIS — N390 Urinary tract infection, site not specified: Secondary | ICD-10-CM | POA: Diagnosis not present

## 2019-03-26 DIAGNOSIS — Z7982 Long term (current) use of aspirin: Secondary | ICD-10-CM | POA: Diagnosis not present

## 2019-03-26 DIAGNOSIS — E785 Hyperlipidemia, unspecified: Secondary | ICD-10-CM | POA: Diagnosis not present

## 2019-03-26 DIAGNOSIS — Z7984 Long term (current) use of oral hypoglycemic drugs: Secondary | ICD-10-CM | POA: Diagnosis not present

## 2019-03-26 DIAGNOSIS — E119 Type 2 diabetes mellitus without complications: Secondary | ICD-10-CM | POA: Diagnosis not present

## 2019-03-26 DIAGNOSIS — I480 Paroxysmal atrial fibrillation: Secondary | ICD-10-CM | POA: Diagnosis not present

## 2019-03-26 DIAGNOSIS — G9341 Metabolic encephalopathy: Secondary | ICD-10-CM | POA: Diagnosis not present

## 2019-03-26 DIAGNOSIS — N289 Disorder of kidney and ureter, unspecified: Secondary | ICD-10-CM | POA: Diagnosis not present

## 2019-03-26 DIAGNOSIS — I071 Rheumatic tricuspid insufficiency: Secondary | ICD-10-CM | POA: Diagnosis not present

## 2019-03-26 DIAGNOSIS — Z7902 Long term (current) use of antithrombotics/antiplatelets: Secondary | ICD-10-CM | POA: Diagnosis not present

## 2019-03-26 DIAGNOSIS — R2681 Unsteadiness on feet: Secondary | ICD-10-CM | POA: Diagnosis not present

## 2019-03-26 DIAGNOSIS — Z9181 History of falling: Secondary | ICD-10-CM | POA: Diagnosis not present

## 2019-03-26 DIAGNOSIS — I440 Atrioventricular block, first degree: Secondary | ICD-10-CM | POA: Diagnosis not present

## 2019-03-26 DIAGNOSIS — I2510 Atherosclerotic heart disease of native coronary artery without angina pectoris: Secondary | ICD-10-CM | POA: Diagnosis not present

## 2019-03-26 DIAGNOSIS — I10 Essential (primary) hypertension: Secondary | ICD-10-CM | POA: Diagnosis not present

## 2019-03-26 DIAGNOSIS — E039 Hypothyroidism, unspecified: Secondary | ICD-10-CM | POA: Diagnosis not present

## 2019-03-26 DIAGNOSIS — Z7901 Long term (current) use of anticoagulants: Secondary | ICD-10-CM | POA: Diagnosis not present

## 2019-03-26 DIAGNOSIS — R001 Bradycardia, unspecified: Secondary | ICD-10-CM | POA: Diagnosis not present

## 2019-03-27 DIAGNOSIS — R410 Disorientation, unspecified: Secondary | ICD-10-CM | POA: Diagnosis not present

## 2019-03-27 DIAGNOSIS — G9341 Metabolic encephalopathy: Secondary | ICD-10-CM | POA: Diagnosis not present

## 2019-03-27 DIAGNOSIS — E1129 Type 2 diabetes mellitus with other diabetic kidney complication: Secondary | ICD-10-CM | POA: Diagnosis not present

## 2019-03-27 DIAGNOSIS — E1165 Type 2 diabetes mellitus with hyperglycemia: Secondary | ICD-10-CM | POA: Diagnosis not present

## 2019-03-27 DIAGNOSIS — Z23 Encounter for immunization: Secondary | ICD-10-CM | POA: Diagnosis not present

## 2019-03-31 DIAGNOSIS — I10 Essential (primary) hypertension: Secondary | ICD-10-CM | POA: Diagnosis not present

## 2019-03-31 DIAGNOSIS — I480 Paroxysmal atrial fibrillation: Secondary | ICD-10-CM | POA: Diagnosis not present

## 2019-03-31 DIAGNOSIS — I440 Atrioventricular block, first degree: Secondary | ICD-10-CM | POA: Diagnosis not present

## 2019-03-31 DIAGNOSIS — Z7984 Long term (current) use of oral hypoglycemic drugs: Secondary | ICD-10-CM | POA: Diagnosis not present

## 2019-03-31 DIAGNOSIS — Z7901 Long term (current) use of anticoagulants: Secondary | ICD-10-CM | POA: Diagnosis not present

## 2019-03-31 DIAGNOSIS — I2510 Atherosclerotic heart disease of native coronary artery without angina pectoris: Secondary | ICD-10-CM | POA: Diagnosis not present

## 2019-03-31 DIAGNOSIS — E1129 Type 2 diabetes mellitus with other diabetic kidney complication: Secondary | ICD-10-CM | POA: Diagnosis not present

## 2019-03-31 DIAGNOSIS — I252 Old myocardial infarction: Secondary | ICD-10-CM | POA: Diagnosis not present

## 2019-03-31 DIAGNOSIS — Z7902 Long term (current) use of antithrombotics/antiplatelets: Secondary | ICD-10-CM | POA: Diagnosis not present

## 2019-03-31 DIAGNOSIS — R001 Bradycardia, unspecified: Secondary | ICD-10-CM | POA: Diagnosis not present

## 2019-03-31 DIAGNOSIS — E785 Hyperlipidemia, unspecified: Secondary | ICD-10-CM | POA: Diagnosis not present

## 2019-03-31 DIAGNOSIS — Z9181 History of falling: Secondary | ICD-10-CM | POA: Diagnosis not present

## 2019-03-31 DIAGNOSIS — R2681 Unsteadiness on feet: Secondary | ICD-10-CM | POA: Diagnosis not present

## 2019-03-31 DIAGNOSIS — N289 Disorder of kidney and ureter, unspecified: Secondary | ICD-10-CM | POA: Diagnosis not present

## 2019-03-31 DIAGNOSIS — Z7982 Long term (current) use of aspirin: Secondary | ICD-10-CM | POA: Diagnosis not present

## 2019-03-31 DIAGNOSIS — D509 Iron deficiency anemia, unspecified: Secondary | ICD-10-CM | POA: Diagnosis not present

## 2019-03-31 DIAGNOSIS — E039 Hypothyroidism, unspecified: Secondary | ICD-10-CM | POA: Diagnosis not present

## 2019-03-31 DIAGNOSIS — E119 Type 2 diabetes mellitus without complications: Secondary | ICD-10-CM | POA: Diagnosis not present

## 2019-03-31 DIAGNOSIS — I071 Rheumatic tricuspid insufficiency: Secondary | ICD-10-CM | POA: Diagnosis not present

## 2019-03-31 DIAGNOSIS — N390 Urinary tract infection, site not specified: Secondary | ICD-10-CM | POA: Diagnosis not present

## 2019-03-31 DIAGNOSIS — G9341 Metabolic encephalopathy: Secondary | ICD-10-CM | POA: Diagnosis not present

## 2019-04-02 DIAGNOSIS — E039 Hypothyroidism, unspecified: Secondary | ICD-10-CM | POA: Diagnosis not present

## 2019-04-02 DIAGNOSIS — I071 Rheumatic tricuspid insufficiency: Secondary | ICD-10-CM | POA: Diagnosis not present

## 2019-04-02 DIAGNOSIS — G9341 Metabolic encephalopathy: Secondary | ICD-10-CM | POA: Diagnosis not present

## 2019-04-02 DIAGNOSIS — N289 Disorder of kidney and ureter, unspecified: Secondary | ICD-10-CM | POA: Diagnosis not present

## 2019-04-02 DIAGNOSIS — Z7982 Long term (current) use of aspirin: Secondary | ICD-10-CM | POA: Diagnosis not present

## 2019-04-02 DIAGNOSIS — Z7902 Long term (current) use of antithrombotics/antiplatelets: Secondary | ICD-10-CM | POA: Diagnosis not present

## 2019-04-02 DIAGNOSIS — I252 Old myocardial infarction: Secondary | ICD-10-CM | POA: Diagnosis not present

## 2019-04-02 DIAGNOSIS — Z7901 Long term (current) use of anticoagulants: Secondary | ICD-10-CM | POA: Diagnosis not present

## 2019-04-02 DIAGNOSIS — Z9181 History of falling: Secondary | ICD-10-CM | POA: Diagnosis not present

## 2019-04-02 DIAGNOSIS — E119 Type 2 diabetes mellitus without complications: Secondary | ICD-10-CM | POA: Diagnosis not present

## 2019-04-02 DIAGNOSIS — E785 Hyperlipidemia, unspecified: Secondary | ICD-10-CM | POA: Diagnosis not present

## 2019-04-02 DIAGNOSIS — I2510 Atherosclerotic heart disease of native coronary artery without angina pectoris: Secondary | ICD-10-CM | POA: Diagnosis not present

## 2019-04-02 DIAGNOSIS — I440 Atrioventricular block, first degree: Secondary | ICD-10-CM | POA: Diagnosis not present

## 2019-04-02 DIAGNOSIS — R001 Bradycardia, unspecified: Secondary | ICD-10-CM | POA: Diagnosis not present

## 2019-04-02 DIAGNOSIS — I480 Paroxysmal atrial fibrillation: Secondary | ICD-10-CM | POA: Diagnosis not present

## 2019-04-02 DIAGNOSIS — N390 Urinary tract infection, site not specified: Secondary | ICD-10-CM | POA: Diagnosis not present

## 2019-04-02 DIAGNOSIS — R2681 Unsteadiness on feet: Secondary | ICD-10-CM | POA: Diagnosis not present

## 2019-04-02 DIAGNOSIS — Z7984 Long term (current) use of oral hypoglycemic drugs: Secondary | ICD-10-CM | POA: Diagnosis not present

## 2019-04-02 DIAGNOSIS — I10 Essential (primary) hypertension: Secondary | ICD-10-CM | POA: Diagnosis not present

## 2019-04-07 DIAGNOSIS — E785 Hyperlipidemia, unspecified: Secondary | ICD-10-CM | POA: Diagnosis not present

## 2019-04-07 DIAGNOSIS — Z9181 History of falling: Secondary | ICD-10-CM | POA: Diagnosis not present

## 2019-04-07 DIAGNOSIS — Z7901 Long term (current) use of anticoagulants: Secondary | ICD-10-CM | POA: Diagnosis not present

## 2019-04-07 DIAGNOSIS — R2681 Unsteadiness on feet: Secondary | ICD-10-CM | POA: Diagnosis not present

## 2019-04-07 DIAGNOSIS — I440 Atrioventricular block, first degree: Secondary | ICD-10-CM | POA: Diagnosis not present

## 2019-04-07 DIAGNOSIS — I2510 Atherosclerotic heart disease of native coronary artery without angina pectoris: Secondary | ICD-10-CM | POA: Diagnosis not present

## 2019-04-07 DIAGNOSIS — N390 Urinary tract infection, site not specified: Secondary | ICD-10-CM | POA: Diagnosis not present

## 2019-04-07 DIAGNOSIS — I071 Rheumatic tricuspid insufficiency: Secondary | ICD-10-CM | POA: Diagnosis not present

## 2019-04-07 DIAGNOSIS — Z7984 Long term (current) use of oral hypoglycemic drugs: Secondary | ICD-10-CM | POA: Diagnosis not present

## 2019-04-07 DIAGNOSIS — I10 Essential (primary) hypertension: Secondary | ICD-10-CM | POA: Diagnosis not present

## 2019-04-07 DIAGNOSIS — R001 Bradycardia, unspecified: Secondary | ICD-10-CM | POA: Diagnosis not present

## 2019-04-07 DIAGNOSIS — E119 Type 2 diabetes mellitus without complications: Secondary | ICD-10-CM | POA: Diagnosis not present

## 2019-04-07 DIAGNOSIS — Z7982 Long term (current) use of aspirin: Secondary | ICD-10-CM | POA: Diagnosis not present

## 2019-04-07 DIAGNOSIS — Z7902 Long term (current) use of antithrombotics/antiplatelets: Secondary | ICD-10-CM | POA: Diagnosis not present

## 2019-04-07 DIAGNOSIS — I480 Paroxysmal atrial fibrillation: Secondary | ICD-10-CM | POA: Diagnosis not present

## 2019-04-07 DIAGNOSIS — G9341 Metabolic encephalopathy: Secondary | ICD-10-CM | POA: Diagnosis not present

## 2019-04-07 DIAGNOSIS — N289 Disorder of kidney and ureter, unspecified: Secondary | ICD-10-CM | POA: Diagnosis not present

## 2019-04-07 DIAGNOSIS — E039 Hypothyroidism, unspecified: Secondary | ICD-10-CM | POA: Diagnosis not present

## 2019-04-07 DIAGNOSIS — I252 Old myocardial infarction: Secondary | ICD-10-CM | POA: Diagnosis not present

## 2019-04-09 DIAGNOSIS — I440 Atrioventricular block, first degree: Secondary | ICD-10-CM | POA: Diagnosis not present

## 2019-04-09 DIAGNOSIS — E785 Hyperlipidemia, unspecified: Secondary | ICD-10-CM | POA: Diagnosis not present

## 2019-04-09 DIAGNOSIS — R2681 Unsteadiness on feet: Secondary | ICD-10-CM | POA: Diagnosis not present

## 2019-04-09 DIAGNOSIS — Z7901 Long term (current) use of anticoagulants: Secondary | ICD-10-CM | POA: Diagnosis not present

## 2019-04-09 DIAGNOSIS — R001 Bradycardia, unspecified: Secondary | ICD-10-CM | POA: Diagnosis not present

## 2019-04-09 DIAGNOSIS — I2510 Atherosclerotic heart disease of native coronary artery without angina pectoris: Secondary | ICD-10-CM | POA: Diagnosis not present

## 2019-04-09 DIAGNOSIS — Z7902 Long term (current) use of antithrombotics/antiplatelets: Secondary | ICD-10-CM | POA: Diagnosis not present

## 2019-04-09 DIAGNOSIS — I071 Rheumatic tricuspid insufficiency: Secondary | ICD-10-CM | POA: Diagnosis not present

## 2019-04-09 DIAGNOSIS — I252 Old myocardial infarction: Secondary | ICD-10-CM | POA: Diagnosis not present

## 2019-04-09 DIAGNOSIS — N390 Urinary tract infection, site not specified: Secondary | ICD-10-CM | POA: Diagnosis not present

## 2019-04-09 DIAGNOSIS — Z9181 History of falling: Secondary | ICD-10-CM | POA: Diagnosis not present

## 2019-04-09 DIAGNOSIS — N289 Disorder of kidney and ureter, unspecified: Secondary | ICD-10-CM | POA: Diagnosis not present

## 2019-04-09 DIAGNOSIS — Z7982 Long term (current) use of aspirin: Secondary | ICD-10-CM | POA: Diagnosis not present

## 2019-04-09 DIAGNOSIS — G9341 Metabolic encephalopathy: Secondary | ICD-10-CM | POA: Diagnosis not present

## 2019-04-09 DIAGNOSIS — I480 Paroxysmal atrial fibrillation: Secondary | ICD-10-CM | POA: Diagnosis not present

## 2019-04-09 DIAGNOSIS — I10 Essential (primary) hypertension: Secondary | ICD-10-CM | POA: Diagnosis not present

## 2019-04-09 DIAGNOSIS — E039 Hypothyroidism, unspecified: Secondary | ICD-10-CM | POA: Diagnosis not present

## 2019-04-09 DIAGNOSIS — E119 Type 2 diabetes mellitus without complications: Secondary | ICD-10-CM | POA: Diagnosis not present

## 2019-04-09 DIAGNOSIS — Z7984 Long term (current) use of oral hypoglycemic drugs: Secondary | ICD-10-CM | POA: Diagnosis not present

## 2019-04-13 DIAGNOSIS — H40013 Open angle with borderline findings, low risk, bilateral: Secondary | ICD-10-CM | POA: Diagnosis not present

## 2019-04-17 DIAGNOSIS — Z7902 Long term (current) use of antithrombotics/antiplatelets: Secondary | ICD-10-CM | POA: Diagnosis not present

## 2019-04-17 DIAGNOSIS — Z7984 Long term (current) use of oral hypoglycemic drugs: Secondary | ICD-10-CM | POA: Diagnosis not present

## 2019-04-17 DIAGNOSIS — E119 Type 2 diabetes mellitus without complications: Secondary | ICD-10-CM | POA: Diagnosis not present

## 2019-04-17 DIAGNOSIS — R2681 Unsteadiness on feet: Secondary | ICD-10-CM | POA: Diagnosis not present

## 2019-04-17 DIAGNOSIS — I2510 Atherosclerotic heart disease of native coronary artery without angina pectoris: Secondary | ICD-10-CM | POA: Diagnosis not present

## 2019-04-17 DIAGNOSIS — Z9181 History of falling: Secondary | ICD-10-CM | POA: Diagnosis not present

## 2019-04-17 DIAGNOSIS — R001 Bradycardia, unspecified: Secondary | ICD-10-CM | POA: Diagnosis not present

## 2019-04-17 DIAGNOSIS — I071 Rheumatic tricuspid insufficiency: Secondary | ICD-10-CM | POA: Diagnosis not present

## 2019-04-17 DIAGNOSIS — Z7901 Long term (current) use of anticoagulants: Secondary | ICD-10-CM | POA: Diagnosis not present

## 2019-04-17 DIAGNOSIS — Z7982 Long term (current) use of aspirin: Secondary | ICD-10-CM | POA: Diagnosis not present

## 2019-04-17 DIAGNOSIS — E039 Hypothyroidism, unspecified: Secondary | ICD-10-CM | POA: Diagnosis not present

## 2019-04-17 DIAGNOSIS — I440 Atrioventricular block, first degree: Secondary | ICD-10-CM | POA: Diagnosis not present

## 2019-04-17 DIAGNOSIS — N289 Disorder of kidney and ureter, unspecified: Secondary | ICD-10-CM | POA: Diagnosis not present

## 2019-04-17 DIAGNOSIS — I10 Essential (primary) hypertension: Secondary | ICD-10-CM | POA: Diagnosis not present

## 2019-04-17 DIAGNOSIS — E785 Hyperlipidemia, unspecified: Secondary | ICD-10-CM | POA: Diagnosis not present

## 2019-04-17 DIAGNOSIS — G9341 Metabolic encephalopathy: Secondary | ICD-10-CM | POA: Diagnosis not present

## 2019-04-17 DIAGNOSIS — I252 Old myocardial infarction: Secondary | ICD-10-CM | POA: Diagnosis not present

## 2019-04-17 DIAGNOSIS — N390 Urinary tract infection, site not specified: Secondary | ICD-10-CM | POA: Diagnosis not present

## 2019-04-17 DIAGNOSIS — I480 Paroxysmal atrial fibrillation: Secondary | ICD-10-CM | POA: Diagnosis not present

## 2019-04-20 ENCOUNTER — Ambulatory Visit: Payer: Medicare Other | Admitting: Cardiovascular Disease

## 2019-04-21 DIAGNOSIS — N390 Urinary tract infection, site not specified: Secondary | ICD-10-CM | POA: Diagnosis not present

## 2019-04-21 DIAGNOSIS — Z7901 Long term (current) use of anticoagulants: Secondary | ICD-10-CM | POA: Diagnosis not present

## 2019-04-21 DIAGNOSIS — Z7902 Long term (current) use of antithrombotics/antiplatelets: Secondary | ICD-10-CM | POA: Diagnosis not present

## 2019-04-21 DIAGNOSIS — Z7982 Long term (current) use of aspirin: Secondary | ICD-10-CM | POA: Diagnosis not present

## 2019-04-21 DIAGNOSIS — I071 Rheumatic tricuspid insufficiency: Secondary | ICD-10-CM | POA: Diagnosis not present

## 2019-04-21 DIAGNOSIS — I440 Atrioventricular block, first degree: Secondary | ICD-10-CM | POA: Diagnosis not present

## 2019-04-21 DIAGNOSIS — G9341 Metabolic encephalopathy: Secondary | ICD-10-CM | POA: Diagnosis not present

## 2019-04-21 DIAGNOSIS — Z9181 History of falling: Secondary | ICD-10-CM | POA: Diagnosis not present

## 2019-04-21 DIAGNOSIS — E785 Hyperlipidemia, unspecified: Secondary | ICD-10-CM | POA: Diagnosis not present

## 2019-04-21 DIAGNOSIS — I480 Paroxysmal atrial fibrillation: Secondary | ICD-10-CM | POA: Diagnosis not present

## 2019-04-21 DIAGNOSIS — R001 Bradycardia, unspecified: Secondary | ICD-10-CM | POA: Diagnosis not present

## 2019-04-21 DIAGNOSIS — I10 Essential (primary) hypertension: Secondary | ICD-10-CM | POA: Diagnosis not present

## 2019-04-21 DIAGNOSIS — Z7984 Long term (current) use of oral hypoglycemic drugs: Secondary | ICD-10-CM | POA: Diagnosis not present

## 2019-04-21 DIAGNOSIS — E039 Hypothyroidism, unspecified: Secondary | ICD-10-CM | POA: Diagnosis not present

## 2019-04-21 DIAGNOSIS — R2681 Unsteadiness on feet: Secondary | ICD-10-CM | POA: Diagnosis not present

## 2019-04-21 DIAGNOSIS — I2510 Atherosclerotic heart disease of native coronary artery without angina pectoris: Secondary | ICD-10-CM | POA: Diagnosis not present

## 2019-04-21 DIAGNOSIS — N289 Disorder of kidney and ureter, unspecified: Secondary | ICD-10-CM | POA: Diagnosis not present

## 2019-04-21 DIAGNOSIS — I252 Old myocardial infarction: Secondary | ICD-10-CM | POA: Diagnosis not present

## 2019-04-21 DIAGNOSIS — E119 Type 2 diabetes mellitus without complications: Secondary | ICD-10-CM | POA: Diagnosis not present

## 2019-04-24 ENCOUNTER — Ambulatory Visit: Payer: Medicare Other | Admitting: Cardiovascular Disease

## 2019-04-28 DIAGNOSIS — N289 Disorder of kidney and ureter, unspecified: Secondary | ICD-10-CM | POA: Diagnosis not present

## 2019-04-28 DIAGNOSIS — Z9181 History of falling: Secondary | ICD-10-CM | POA: Diagnosis not present

## 2019-04-28 DIAGNOSIS — E119 Type 2 diabetes mellitus without complications: Secondary | ICD-10-CM | POA: Diagnosis not present

## 2019-04-28 DIAGNOSIS — R001 Bradycardia, unspecified: Secondary | ICD-10-CM | POA: Diagnosis not present

## 2019-04-28 DIAGNOSIS — I2510 Atherosclerotic heart disease of native coronary artery without angina pectoris: Secondary | ICD-10-CM | POA: Diagnosis not present

## 2019-04-28 DIAGNOSIS — I252 Old myocardial infarction: Secondary | ICD-10-CM | POA: Diagnosis not present

## 2019-04-28 DIAGNOSIS — G9341 Metabolic encephalopathy: Secondary | ICD-10-CM | POA: Diagnosis not present

## 2019-04-28 DIAGNOSIS — I071 Rheumatic tricuspid insufficiency: Secondary | ICD-10-CM | POA: Diagnosis not present

## 2019-04-28 DIAGNOSIS — Z7984 Long term (current) use of oral hypoglycemic drugs: Secondary | ICD-10-CM | POA: Diagnosis not present

## 2019-04-28 DIAGNOSIS — Z7982 Long term (current) use of aspirin: Secondary | ICD-10-CM | POA: Diagnosis not present

## 2019-04-28 DIAGNOSIS — I10 Essential (primary) hypertension: Secondary | ICD-10-CM | POA: Diagnosis not present

## 2019-04-28 DIAGNOSIS — R2681 Unsteadiness on feet: Secondary | ICD-10-CM | POA: Diagnosis not present

## 2019-04-28 DIAGNOSIS — E039 Hypothyroidism, unspecified: Secondary | ICD-10-CM | POA: Diagnosis not present

## 2019-04-28 DIAGNOSIS — N390 Urinary tract infection, site not specified: Secondary | ICD-10-CM | POA: Diagnosis not present

## 2019-04-28 DIAGNOSIS — I440 Atrioventricular block, first degree: Secondary | ICD-10-CM | POA: Diagnosis not present

## 2019-04-28 DIAGNOSIS — Z7901 Long term (current) use of anticoagulants: Secondary | ICD-10-CM | POA: Diagnosis not present

## 2019-04-28 DIAGNOSIS — Z7902 Long term (current) use of antithrombotics/antiplatelets: Secondary | ICD-10-CM | POA: Diagnosis not present

## 2019-04-28 DIAGNOSIS — E785 Hyperlipidemia, unspecified: Secondary | ICD-10-CM | POA: Diagnosis not present

## 2019-04-28 DIAGNOSIS — I480 Paroxysmal atrial fibrillation: Secondary | ICD-10-CM | POA: Diagnosis not present

## 2019-05-04 DIAGNOSIS — E039 Hypothyroidism, unspecified: Secondary | ICD-10-CM | POA: Diagnosis not present

## 2019-05-05 DIAGNOSIS — N390 Urinary tract infection, site not specified: Secondary | ICD-10-CM | POA: Diagnosis not present

## 2019-05-05 DIAGNOSIS — R001 Bradycardia, unspecified: Secondary | ICD-10-CM | POA: Diagnosis not present

## 2019-05-05 DIAGNOSIS — E785 Hyperlipidemia, unspecified: Secondary | ICD-10-CM | POA: Diagnosis not present

## 2019-05-05 DIAGNOSIS — Z9181 History of falling: Secondary | ICD-10-CM | POA: Diagnosis not present

## 2019-05-05 DIAGNOSIS — Z7902 Long term (current) use of antithrombotics/antiplatelets: Secondary | ICD-10-CM | POA: Diagnosis not present

## 2019-05-05 DIAGNOSIS — E119 Type 2 diabetes mellitus without complications: Secondary | ICD-10-CM | POA: Diagnosis not present

## 2019-05-05 DIAGNOSIS — E039 Hypothyroidism, unspecified: Secondary | ICD-10-CM | POA: Diagnosis not present

## 2019-05-05 DIAGNOSIS — I2510 Atherosclerotic heart disease of native coronary artery without angina pectoris: Secondary | ICD-10-CM | POA: Diagnosis not present

## 2019-05-05 DIAGNOSIS — I071 Rheumatic tricuspid insufficiency: Secondary | ICD-10-CM | POA: Diagnosis not present

## 2019-05-05 DIAGNOSIS — I480 Paroxysmal atrial fibrillation: Secondary | ICD-10-CM | POA: Diagnosis not present

## 2019-05-05 DIAGNOSIS — I252 Old myocardial infarction: Secondary | ICD-10-CM | POA: Diagnosis not present

## 2019-05-05 DIAGNOSIS — N289 Disorder of kidney and ureter, unspecified: Secondary | ICD-10-CM | POA: Diagnosis not present

## 2019-05-05 DIAGNOSIS — I440 Atrioventricular block, first degree: Secondary | ICD-10-CM | POA: Diagnosis not present

## 2019-05-05 DIAGNOSIS — Z7984 Long term (current) use of oral hypoglycemic drugs: Secondary | ICD-10-CM | POA: Diagnosis not present

## 2019-05-05 DIAGNOSIS — Z7982 Long term (current) use of aspirin: Secondary | ICD-10-CM | POA: Diagnosis not present

## 2019-05-05 DIAGNOSIS — Z7901 Long term (current) use of anticoagulants: Secondary | ICD-10-CM | POA: Diagnosis not present

## 2019-05-05 DIAGNOSIS — I10 Essential (primary) hypertension: Secondary | ICD-10-CM | POA: Diagnosis not present

## 2019-05-05 DIAGNOSIS — R2681 Unsteadiness on feet: Secondary | ICD-10-CM | POA: Diagnosis not present

## 2019-05-05 DIAGNOSIS — G9341 Metabolic encephalopathy: Secondary | ICD-10-CM | POA: Diagnosis not present

## 2019-05-11 DIAGNOSIS — E785 Hyperlipidemia, unspecified: Secondary | ICD-10-CM | POA: Diagnosis not present

## 2019-05-11 DIAGNOSIS — R001 Bradycardia, unspecified: Secondary | ICD-10-CM | POA: Diagnosis not present

## 2019-05-11 DIAGNOSIS — E119 Type 2 diabetes mellitus without complications: Secondary | ICD-10-CM | POA: Diagnosis not present

## 2019-05-11 DIAGNOSIS — I071 Rheumatic tricuspid insufficiency: Secondary | ICD-10-CM | POA: Diagnosis not present

## 2019-05-11 DIAGNOSIS — I252 Old myocardial infarction: Secondary | ICD-10-CM | POA: Diagnosis not present

## 2019-05-11 DIAGNOSIS — I440 Atrioventricular block, first degree: Secondary | ICD-10-CM | POA: Diagnosis not present

## 2019-05-11 DIAGNOSIS — I2510 Atherosclerotic heart disease of native coronary artery without angina pectoris: Secondary | ICD-10-CM | POA: Diagnosis not present

## 2019-05-11 DIAGNOSIS — E039 Hypothyroidism, unspecified: Secondary | ICD-10-CM | POA: Diagnosis not present

## 2019-05-11 DIAGNOSIS — R2681 Unsteadiness on feet: Secondary | ICD-10-CM | POA: Diagnosis not present

## 2019-05-11 DIAGNOSIS — I10 Essential (primary) hypertension: Secondary | ICD-10-CM | POA: Diagnosis not present

## 2019-05-11 DIAGNOSIS — N390 Urinary tract infection, site not specified: Secondary | ICD-10-CM | POA: Diagnosis not present

## 2019-05-11 DIAGNOSIS — Z7901 Long term (current) use of anticoagulants: Secondary | ICD-10-CM | POA: Diagnosis not present

## 2019-05-11 DIAGNOSIS — Z7982 Long term (current) use of aspirin: Secondary | ICD-10-CM | POA: Diagnosis not present

## 2019-05-11 DIAGNOSIS — Z7902 Long term (current) use of antithrombotics/antiplatelets: Secondary | ICD-10-CM | POA: Diagnosis not present

## 2019-05-11 DIAGNOSIS — Z9181 History of falling: Secondary | ICD-10-CM | POA: Diagnosis not present

## 2019-05-11 DIAGNOSIS — G9341 Metabolic encephalopathy: Secondary | ICD-10-CM | POA: Diagnosis not present

## 2019-05-11 DIAGNOSIS — Z7984 Long term (current) use of oral hypoglycemic drugs: Secondary | ICD-10-CM | POA: Diagnosis not present

## 2019-05-11 DIAGNOSIS — I480 Paroxysmal atrial fibrillation: Secondary | ICD-10-CM | POA: Diagnosis not present

## 2019-05-11 DIAGNOSIS — N289 Disorder of kidney and ureter, unspecified: Secondary | ICD-10-CM | POA: Diagnosis not present

## 2019-05-12 NOTE — Progress Notes (Signed)
Patient ID: Melissa Jackson, female   DOB: 06-02-1941, 78 y.o.   MRN: [id number]     Cardiology Office Note   Date:  05/14/2019   ID:  Melissa Jackson, DOB Aug 29, 1940, MRN [id number]  PCP:  [name], MD  Cardiologist:   Jenkins Rouge, MD   No chief complaint on file.     History of Present Illness:  78 y.o. f/u CAD. Lives in [place] and sees [name] as primary. Previous patient of [name]. September 2014 presented with angina DES to LAD and Ramus with POB of D1 EF 55% PAF with SSS. Maintaining NSR on amiodarone Beta blocker dose has been decreased due to low HR's. Intolerant to statin with muscle wasting and pain Cough with ACE but tolerates ARB for BP control.   Admitted to hospital 03/18/19 with weakness and confusion UTI started on cipro 03/16/19 BS low 51 Rx dextrose Synthroid dose reduced for suppressed TSH  She seems to have more advanced demential and not very interactive during interview Husband takes good care of her and administers her meds now    Past Medical History:  Diagnosis Date  . Acute myocardial infarction, subendocardial infarction, initial episode of care ([place])   . Anemia   . Anticoagulated    ON PRADAXA  . Atrial fibrillation ([place])   . Coronary artery disease   . Diabetes mellitus with no complication ([place])   . Disease of tricuspid valve    MILD  . Fall   . First degree atrioventricular block   . Head ache   . High risk medication use   . History of total hysterectomy   . Hyperlipidemia    SEVERE  . Hypertension, essential   . Hypothyroidism   . Left atrial enlargement    MILD  . Mitral valvular disorder    MILD-MODERATE  . Old myocardial infarction    JAN 2008  . Takotsubo syndrome     Past Surgical History:  Procedure Laterality Date  . APPENDECTOMY    . COLONOSCOPY  03/28/2005   Moderate left colonic diverticulosis. Internal hemorrhoids.  . INCONTINENCE SURGERY    . NO PAST SURGERIES    . TOTAL ABDOMINAL HYSTERECTOMY        Current Outpatient Medications  Medication Sig Dispense Refill  . alendronate (FOSAMAX) 70 MG tablet Take 70 mg by mouth once a week. Takes on fridays    . amiodarone (PACERONE) 200 MG tablet Take 200 mg by mouth daily.    Marland Kitchen amLODipine (NORVASC) 5 MG tablet Take 5 mg by mouth daily.    Marland Kitchen aspirin 81 MG tablet Take 81 mg by mouth daily.    . chlorthalidone (HYGROTON) 25 MG tablet Take 25 mg by mouth every morning.    . clopidogrel (PLAVIX) 75 MG tablet Take 75 mg by mouth daily.    . ferrous sulfate 325 (65 FE) MG tablet Take 325 mg by mouth 3 (three) times daily with meals.    Marland Kitchen levothyroxine (SYNTHROID) 112 MCG tablet Take 1 tablet (112 mcg total) by mouth daily before breakfast. 30 tablet 0  . losartan (COZAAR) 25 MG tablet Take 1 tablet by mouth once daily 90 tablet 2  . metFORMIN (GLUCOPHAGE) 1000 MG tablet Take 1,000 mg by mouth 2 (two) times daily.    . metoprolol tartrate (LOPRESSOR) 25 MG tablet Take 0.5 tablets (12.5 mg total) by mouth 2 (two) times daily. 30 tablet 0  . Multiple Vitamins-Minerals (MULTIVITAMIN WITH MINERALS) tablet Take 1 tablet by mouth  daily.    . nitroGLYCERIN (NITROSTAT) 0.4 MG SL tablet Place 0.4 mg under the tongue every 5 (five) minutes as needed for chest pain.    . Omega 3 1000 MG CAPS Take 2 capsules by mouth 2 (two) times daily.    . pravastatin (PRAVACHOL) 20 MG tablet as directed.  5  . Red Yeast Rice 600 MG CAPS Take 1 capsule by mouth daily.      No current facility-administered medications for this visit.    Allergies:   Lisinopril, Onion, Codeine, Morphine and related, and Statins    Social History:  The patient  reports that she has never smoked. She has never used smokeless tobacco. She reports that she does not drink alcohol or use drugs.   Family History:  The patient's family history includes Alzheimer's disease in her mother; Diabetes Mellitus I in her mother; Heart attack in her father.    ROS:  Please see the history of present  illness.   Otherwise, review of systems are positive for none.   All other systems are reviewed and negative.    PHYSICAL EXAM: VS:  BP 128/66   Pulse (!) 56   Ht 5\' 4"  (1.626 m)   Wt 107 lb (48.5 kg)   LMP  (LMP Unknown)   SpO2 98%   BMI 18.37 kg/m  , BMI Body mass index is 18.37 kg/m. Affect appropriate Healthy:  appears stated age 78: normal Neck supple with no adenopathy JVP normal no bruits no thyromegaly Lungs clear with no wheezing and good diaphragmatic motion Heart:  S1/S2 no murmur, no rub, gallop or click PMI normal Abdomen: benighn, BS positve, no tenderness, no AAA no bruit.  No HSM or HJR Distal pulses intact with no bruits No edema Neuro non-focal Skin warm and dry No muscular weakness     EKG: 10/10/17 SR rate 58 nonspecific ST changes    Recent Labs: 03/18/2019: ALT 11; TSH 0.051 03/19/2019: BUN 11; Creatinine, Ser 1.11; Hemoglobin 11.6; Platelets 189; Potassium 3.4; Sodium 138    Lipid Panel No results found for: CHOL, TRIG, HDL, CHOLHDL, VLDL, LDLCALC, LDLDIRECT    Wt Readings from Last 3 Encounters:  05/14/19 107 lb (48.5 kg)  03/18/19 108 lb 7.5 oz (49.2 kg)  04/21/18 125 lb 4 oz (56.8 kg)      Other studies Reviewed: Additional studies/ records that were reviewed today include: Records from [place], [place], [place] and [place] .    ASSESSMENT AND PLAN:  1.  CAD:  Given complexity of intervention 2014 continue DAT.  No angina medical Rx 2. PAF:  Continue low dose amiodarone. PFTls ok 10/25/17   3. SSS:  Lower dose lopressor to 25 bid and f/u no syncopal symptoms or HB 4. Chol:  Discussed taking 1200 mg red yeast rice daly   5. HTN:  With DM and significant CAD on ARB tolerating well Significant white coat component She assures me home readings are fine always high in our office  6. Thyroid TSH suppressed .051 03/18/19 f/u Dr Delena Bali suggest lower dose  On d/c 112 ug daily  7. Confusion: related to low BS and UTI resolved   Current  medicines are reviewed at length with the patient today.  The patient does not have concerns regarding medicines.    Signed, Jenkins Rouge, MD  05/14/2019 9:24 AM    [name] [place], [place], [place]  [address] Phone: [phone]; Fax: [phone]

## 2019-05-14 ENCOUNTER — Encounter: Payer: Self-pay | Admitting: Cardiovascular Disease

## 2019-05-14 ENCOUNTER — Ambulatory Visit (INDEPENDENT_AMBULATORY_CARE_PROVIDER_SITE_OTHER): Payer: Medicare Other | Admitting: Cardiovascular Disease

## 2019-05-14 ENCOUNTER — Other Ambulatory Visit: Payer: Self-pay

## 2019-05-14 VITALS — BP 128/66 | HR 56 | Ht 64.0 in | Wt 107.0 lb

## 2019-05-14 DIAGNOSIS — I480 Paroxysmal atrial fibrillation: Secondary | ICD-10-CM | POA: Diagnosis not present

## 2019-05-14 NOTE — Patient Instructions (Signed)

## 2019-05-21 DIAGNOSIS — Z0000 Encounter for general adult medical examination without abnormal findings: Secondary | ICD-10-CM | POA: Diagnosis not present

## 2019-05-21 DIAGNOSIS — Z1231 Encounter for screening mammogram for malignant neoplasm of breast: Secondary | ICD-10-CM | POA: Diagnosis not present

## 2019-05-21 DIAGNOSIS — Z9181 History of falling: Secondary | ICD-10-CM | POA: Diagnosis not present

## 2019-05-21 DIAGNOSIS — E785 Hyperlipidemia, unspecified: Secondary | ICD-10-CM | POA: Diagnosis not present

## 2019-05-21 DIAGNOSIS — N959 Unspecified menopausal and perimenopausal disorder: Secondary | ICD-10-CM | POA: Diagnosis not present

## 2019-06-08 DIAGNOSIS — E039 Hypothyroidism, unspecified: Secondary | ICD-10-CM | POA: Diagnosis not present

## 2019-07-02 ENCOUNTER — Other Ambulatory Visit: Payer: Self-pay | Admitting: Cardiovascular Disease

## 2019-07-10 DIAGNOSIS — E039 Hypothyroidism, unspecified: Secondary | ICD-10-CM | POA: Diagnosis not present

## 2019-09-25 DIAGNOSIS — E785 Hyperlipidemia, unspecified: Secondary | ICD-10-CM | POA: Diagnosis not present

## 2019-09-25 DIAGNOSIS — D509 Iron deficiency anemia, unspecified: Secondary | ICD-10-CM | POA: Diagnosis not present

## 2019-09-25 DIAGNOSIS — E1165 Type 2 diabetes mellitus with hyperglycemia: Secondary | ICD-10-CM | POA: Diagnosis not present

## 2019-09-25 DIAGNOSIS — E1129 Type 2 diabetes mellitus with other diabetic kidney complication: Secondary | ICD-10-CM | POA: Diagnosis not present

## 2019-09-25 DIAGNOSIS — E039 Hypothyroidism, unspecified: Secondary | ICD-10-CM | POA: Diagnosis not present

## 2019-09-25 DIAGNOSIS — I10 Essential (primary) hypertension: Secondary | ICD-10-CM | POA: Diagnosis not present

## 2019-09-30 DIAGNOSIS — D539 Nutritional anemia, unspecified: Secondary | ICD-10-CM | POA: Diagnosis not present

## 2019-10-07 DIAGNOSIS — D649 Anemia, unspecified: Secondary | ICD-10-CM | POA: Diagnosis not present

## 2019-10-14 DIAGNOSIS — E538 Deficiency of other specified B group vitamins: Secondary | ICD-10-CM | POA: Diagnosis not present

## 2019-10-21 DIAGNOSIS — E538 Deficiency of other specified B group vitamins: Secondary | ICD-10-CM | POA: Diagnosis not present

## 2019-10-28 DIAGNOSIS — E538 Deficiency of other specified B group vitamins: Secondary | ICD-10-CM | POA: Diagnosis not present

## 2019-11-30 DIAGNOSIS — E538 Deficiency of other specified B group vitamins: Secondary | ICD-10-CM | POA: Diagnosis not present

## 2019-12-08 NOTE — Progress Notes (Signed)
Patient ID: Melissa Jackson, female   DOB: 1941-03-29, 80 y.o.   MRN: [id number]     Cardiology Office Note   Date:  12/08/2019   ID:  Melissa Jackson, DOB 19-Nov-1940, MRN [id number]  PCP:  [name], MD  Cardiologist:   Jenkins Rouge, MD   No chief complaint on file.     History of Present Illness:  79 y.o. f/u CAD. Lives in [place] and sees [name] as primary. Previous patient of [name]. September 2014 presented with angina DES to LAD and Ramus with POB of D1 EF 55% PAF with SSS. Maintaining NSR on amiodarone Beta blocker dose has been decreased due to low HR's. Intolerant to statin with muscle wasting and pain Cough with ACE but tolerates ARB for BP control.   Admitted to hospital 03/18/19 with weakness and confusion UTI started on cipro 03/16/19 BS low 51 Rx dextrose Synthroid dose reduced for suppressed TSH  She seems to have more advanced demential and not very interactive during interview Husband takes good care of her and administers her meds now Needs f/u PFT;s and labs for  Amiodarone Rx  Has had COVID vaccine  No palpitations chest pain dyspnea or bleeding issues    Past Medical History:  Diagnosis Date  . Acute myocardial infarction, subendocardial infarction, initial episode of care ([place])   . Anemia   . Anticoagulated    ON PRADAXA  . Atrial fibrillation ([place])   . Coronary artery disease   . Diabetes mellitus with no complication ([place])   . Disease of tricuspid valve    MILD  . Fall   . First degree atrioventricular block   . Head ache   . High risk medication use   . History of total hysterectomy   . Hyperlipidemia    SEVERE  . Hypertension, essential   . Hypothyroidism   . Left atrial enlargement    MILD  . Mitral valvular disorder    MILD-MODERATE  . Old myocardial infarction    JAN 2008  . Takotsubo syndrome     Past Surgical History:  Procedure Laterality Date  . APPENDECTOMY    . COLONOSCOPY  03/28/2005   Moderate left colonic  diverticulosis. Internal hemorrhoids.  . INCONTINENCE SURGERY    . NO PAST SURGERIES    . TOTAL ABDOMINAL HYSTERECTOMY       Current Outpatient Medications  Medication Sig Dispense Refill  . alendronate (FOSAMAX) 70 MG tablet Take 70 mg by mouth once a week. Takes on fridays    . amiodarone (PACERONE) 200 MG tablet Take 200 mg by mouth daily.    Marland Kitchen amLODipine (NORVASC) 5 MG tablet Take 5 mg by mouth daily.    Marland Kitchen aspirin 81 MG tablet Take 81 mg by mouth daily.    . chlorthalidone (HYGROTON) 25 MG tablet Take 25 mg by mouth every morning.    . clopidogrel (PLAVIX) 75 MG tablet Take 75 mg by mouth daily.    . ferrous sulfate 325 (65 FE) MG tablet Take 325 mg by mouth 3 (three) times daily with meals.    Marland Kitchen levothyroxine (SYNTHROID) 112 MCG tablet Take 1 tablet (112 mcg total) by mouth daily before breakfast. 30 tablet 0  . losartan (COZAAR) 25 MG tablet Take 1 tablet by mouth once daily 90 tablet 3  . metFORMIN (GLUCOPHAGE) 1000 MG tablet Take 1,000 mg by mouth 2 (two) times daily.    . metoprolol tartrate (LOPRESSOR) 25 MG tablet Take 0.5 tablets (12.5 mg  total) by mouth 2 (two) times daily. 30 tablet 0  . Multiple Vitamins-Minerals (MULTIVITAMIN WITH MINERALS) tablet Take 1 tablet by mouth daily.    . nitroGLYCERIN (NITROSTAT) 0.4 MG SL tablet Place 0.4 mg under the tongue every 5 (five) minutes as needed for chest pain.    . Omega 3 1000 MG CAPS Take 2 capsules by mouth 2 (two) times daily.    . pravastatin (PRAVACHOL) 20 MG tablet as directed.  5  . Red Yeast Rice 600 MG CAPS Take 1 capsule by mouth daily.      No current facility-administered medications for this visit.    Allergies:   Lisinopril, Onion, Codeine, Morphine and related, and Statins    Social History:  The patient  reports that she has never smoked. She has never used smokeless tobacco. She reports that she does not drink alcohol and does not use drugs.   Family History:  The patient's family history includes  Alzheimer's disease in her mother; Diabetes Mellitus I in her mother; Heart attack in her father.    ROS:  Please see the history of present illness.   Otherwise, review of systems are positive for none.   All other systems are reviewed and negative.    PHYSICAL EXAM: VS:  LMP  (LMP Unknown)  , BMI There is no height or weight on file to calculate BMI. Affect appropriate Healthy:  appears stated age 34: normal Neck supple with no adenopathy JVP normal no bruits no thyromegaly Lungs clear with no wheezing and good diaphragmatic motion Heart:  S1/S2 no murmur, no rub, gallop or click PMI normal Abdomen: benighn, BS positve, no tenderness, no AAA no bruit.  No HSM or HJR Distal pulses intact with no bruits No edema Neuro non-focal Skin warm and dry No muscular weakness   EKG: 10/10/17 SR rate 58 nonspecific ST changes    Recent Labs: 03/18/2019: ALT 11; TSH 0.051 03/19/2019: BUN 11; Creatinine, Ser 1.11; Hemoglobin 11.6; Platelets 189; Potassium 3.4; Sodium 138    Lipid Panel No results found for: CHOL, TRIG, HDL, CHOLHDL, VLDL, LDLCALC, LDLDIRECT    Wt Readings from Last 3 Encounters:  05/14/19 107 lb (48.5 kg)  03/18/19 108 lb 7.5 oz (49.2 kg)  04/21/18 125 lb 4 oz (56.8 kg)      Other studies Reviewed: Additional studies/ records that were reviewed today include: Records from [place], [place], [place] and [place] .    ASSESSMENT AND PLAN:  1. CAD:  Given complexity of intervention 2014 continue DAT.  No angina medical Rx 2. PAF:  Continue low dose amiodarone. PFTls ok 10/25/17  Check PFT's TSH/LFT;s  3. SSS:  Lower dose lopressor to 25 bid and f/u no syncopal symptoms or HB 4. Chol:  Discussed taking 1200 mg red yeast rice daly   5. HTN:  With DM and significant CAD on ARB tolerating well Significant white coat component She assures me home readings are fine always high in our office  6. Thyroid TSH suppressed .051 03/18/19 f/u Dr Delena Bali suggest lower dose  On d/c  112 ug daily  7. Confusion: related to low BS and UTI resolved   Current medicines are reviewed at length with the patient today.  The patient does not have concerns regarding medicines.  PFT;s with DLCO -> amiodarone Rx LFTls TSH/ T4 -> amiodarone Rx   F/U in 6 months   Signed, Jenkins Rouge, MD  12/08/2019 6:06 PM    [facility]  [address], [place], [place]  [address] Phone: [phone]; Fax: [phone]

## 2019-12-11 ENCOUNTER — Ambulatory Visit: Payer: Medicare Other | Admitting: Cardiovascular Disease

## 2019-12-11 ENCOUNTER — Other Ambulatory Visit: Payer: Self-pay

## 2019-12-11 ENCOUNTER — Encounter: Payer: Self-pay | Admitting: Cardiovascular Disease

## 2019-12-11 VITALS — BP 162/78 | HR 51 | Ht 64.0 in | Wt 106.0 lb

## 2019-12-11 DIAGNOSIS — Z79899 Other long term (current) drug therapy: Secondary | ICD-10-CM | POA: Diagnosis not present

## 2019-12-11 DIAGNOSIS — I480 Paroxysmal atrial fibrillation: Secondary | ICD-10-CM

## 2019-12-11 NOTE — Patient Instructions (Addendum)
Medication Instructions:  *If you need a refill on your cardiac medications before your next appointment, please call your pharmacy*  Lab Work: Your physician recommends that you have lab work- LFT, TSH, T4  If you have labs (blood work) drawn today and your tests are completely normal, you will receive your results only by: Marland Kitchen MyChart Message (if you have MyChart) OR . A paper copy in the mail If you have any lab test that is abnormal or we need to change your treatment, we will call you to review the results.  Testing/Procedures: Your physician has recommended that you have a pulmonary function test. Pulmonary Function Tests are a group of tests that measure how well air moves in and out of your lungs.  Follow-Up: At [facility], you and your health needs are our priority.  As part of our continuing mission to provide you with exceptional heart care, we have created designated Provider Care Teams.  These Care Teams include your primary Cardiologist (physician) and Advanced Practice Providers (APPs -  Physician Assistants and Nurse Practitioners) who all work together to provide you with the care you need, when you need it.  We recommend signing up for the patient portal called "MyChart".  Sign up information is provided on this After Visit Summary.  MyChart is used to connect with patients for Virtual Visits (Telemedicine).  Patients are able to view lab/test results, encounter notes, upcoming appointments, etc.  Non-urgent messages can be sent to your provider as well.   To learn more about what you can do with MyChart, go to [email or web address].    Your next appointment:   6 month(s)  The format for your next appointment:   In Person  Provider:   You may see Jenkins Rouge, MD or one of the following Advanced Practice Providers on your designated Care Team:    Truitt Merle, NP  Cecilie Kicks, NP  Kathyrn Drown, NP

## 2019-12-16 ENCOUNTER — Telehealth: Payer: Self-pay | Admitting: Cardiovascular Disease

## 2019-12-16 ENCOUNTER — Ambulatory Visit (INDEPENDENT_AMBULATORY_CARE_PROVIDER_SITE_OTHER): Payer: Medicare Other | Admitting: Internal Medicine

## 2019-12-16 ENCOUNTER — Other Ambulatory Visit: Payer: Self-pay

## 2019-12-16 DIAGNOSIS — Z79899 Other long term (current) drug therapy: Secondary | ICD-10-CM

## 2019-12-16 LAB — PULMONARY FUNCTION TEST
DL/VA % pred: 79 %
DL/VA: 3.24 ml/min/mmHg/L
DLCO cor % pred: 80 %
DLCO cor: 15.07 ml/min/mmHg
DLCO unc % pred: 80 %
DLCO unc: 15.07 ml/min/mmHg
FEF 25-75 Post: 1.74 L/sec
FEF 25-75 Pre: 2.18 L/sec
FEF2575-%Change-Post: -20 %
FEF2575-%Pred-Post: 127 %
FEF2575-%Pred-Pre: 160 %
FEV1-%Change-Post: -7 %
FEV1-%Pred-Post: 117 %
FEV1-%Pred-Pre: 126 %
FEV1-Post: 2.23 L
FEV1-Pre: 2.41 L
FEV1FVC-%Change-Post: -4 %
FEV1FVC-%Pred-Pre: 105 %
FEV6-%Change-Post: -2 %
FEV6-%Pred-Post: 123 %
FEV6-%Pred-Pre: 126 %
FEV6-Post: 2.99 L
FEV6-Pre: 3.07 L
FEV6FVC-%Change-Post: 0 %
FEV6FVC-%Pred-Post: 105 %
FEV6FVC-%Pred-Pre: 105 %
FVC-%Change-Post: -3 %
FVC-%Pred-Post: 116 %
FVC-%Pred-Pre: 120 %
FVC-Post: 2.99 L
FVC-Pre: 3.09 L
Post FEV1/FVC ratio: 75 %
Post FEV6/FVC ratio: 100 %
Pre FEV1/FVC ratio: 78 %
Pre FEV6/FVC Ratio: 100 %

## 2019-12-16 NOTE — Progress Notes (Signed)
PFT done today. 

## 2019-12-16 NOTE — Telephone Encounter (Signed)
    Pt's husband is returning call from Dr. Johnsie Cancel regarding pt's pulmonary function test

## 2019-12-17 NOTE — Telephone Encounter (Signed)
Tried to call patient's husband back, but there was no answer.

## 2019-12-31 DIAGNOSIS — E039 Hypothyroidism, unspecified: Secondary | ICD-10-CM | POA: Diagnosis not present

## 2019-12-31 DIAGNOSIS — D509 Iron deficiency anemia, unspecified: Secondary | ICD-10-CM | POA: Diagnosis not present

## 2019-12-31 DIAGNOSIS — E1129 Type 2 diabetes mellitus with other diabetic kidney complication: Secondary | ICD-10-CM | POA: Diagnosis not present

## 2019-12-31 DIAGNOSIS — E785 Hyperlipidemia, unspecified: Secondary | ICD-10-CM | POA: Diagnosis not present

## 2019-12-31 DIAGNOSIS — D519 Vitamin B12 deficiency anemia, unspecified: Secondary | ICD-10-CM | POA: Diagnosis not present

## 2019-12-31 DIAGNOSIS — E1165 Type 2 diabetes mellitus with hyperglycemia: Secondary | ICD-10-CM | POA: Diagnosis not present

## 2020-01-01 NOTE — Telephone Encounter (Signed)
Patient aware of results.

## 2020-01-08 DIAGNOSIS — I252 Old myocardial infarction: Secondary | ICD-10-CM | POA: Diagnosis not present

## 2020-01-08 DIAGNOSIS — E119 Type 2 diabetes mellitus without complications: Secondary | ICD-10-CM | POA: Diagnosis not present

## 2020-01-08 DIAGNOSIS — Z79899 Other long term (current) drug therapy: Secondary | ICD-10-CM | POA: Diagnosis not present

## 2020-01-08 DIAGNOSIS — I4891 Unspecified atrial fibrillation: Secondary | ICD-10-CM | POA: Diagnosis not present

## 2020-01-08 DIAGNOSIS — Z888 Allergy status to other drugs, medicaments and biological substances status: Secondary | ICD-10-CM | POA: Diagnosis not present

## 2020-01-08 DIAGNOSIS — Z8673 Personal history of transient ischemic attack (TIA), and cerebral infarction without residual deficits: Secondary | ICD-10-CM | POA: Diagnosis not present

## 2020-01-08 DIAGNOSIS — K458 Other specified abdominal hernia without obstruction or gangrene: Secondary | ICD-10-CM | POA: Diagnosis not present

## 2020-01-08 DIAGNOSIS — E872 Acidosis: Secondary | ICD-10-CM | POA: Diagnosis not present

## 2020-01-08 DIAGNOSIS — Z87440 Personal history of urinary (tract) infections: Secondary | ICD-10-CM | POA: Diagnosis not present

## 2020-01-08 DIAGNOSIS — E039 Hypothyroidism, unspecified: Secondary | ICD-10-CM | POA: Diagnosis not present

## 2020-01-08 DIAGNOSIS — K56609 Unspecified intestinal obstruction, unspecified as to partial versus complete obstruction: Secondary | ICD-10-CM | POA: Diagnosis not present

## 2020-01-08 DIAGNOSIS — N178 Other acute kidney failure: Secondary | ICD-10-CM | POA: Diagnosis not present

## 2020-01-08 DIAGNOSIS — E860 Dehydration: Secondary | ICD-10-CM | POA: Diagnosis not present

## 2020-01-08 DIAGNOSIS — Z7984 Long term (current) use of oral hypoglycemic drugs: Secondary | ICD-10-CM | POA: Diagnosis not present

## 2020-01-08 DIAGNOSIS — I10 Essential (primary) hypertension: Secondary | ICD-10-CM | POA: Diagnosis not present

## 2020-01-08 DIAGNOSIS — K8020 Calculus of gallbladder without cholecystitis without obstruction: Secondary | ICD-10-CM | POA: Diagnosis not present

## 2020-01-08 DIAGNOSIS — N179 Acute kidney failure, unspecified: Secondary | ICD-10-CM | POA: Diagnosis not present

## 2020-01-08 DIAGNOSIS — Z7982 Long term (current) use of aspirin: Secondary | ICD-10-CM | POA: Diagnosis not present

## 2020-01-08 DIAGNOSIS — K469 Unspecified abdominal hernia without obstruction or gangrene: Secondary | ICD-10-CM | POA: Diagnosis not present

## 2020-01-09 DIAGNOSIS — N179 Acute kidney failure, unspecified: Secondary | ICD-10-CM | POA: Diagnosis not present

## 2020-01-09 DIAGNOSIS — K56609 Unspecified intestinal obstruction, unspecified as to partial versus complete obstruction: Secondary | ICD-10-CM | POA: Diagnosis not present

## 2020-01-09 DIAGNOSIS — K458 Other specified abdominal hernia without obstruction or gangrene: Secondary | ICD-10-CM | POA: Diagnosis not present

## 2020-01-13 DIAGNOSIS — R109 Unspecified abdominal pain: Secondary | ICD-10-CM | POA: Diagnosis not present

## 2020-01-13 DIAGNOSIS — K458 Other specified abdominal hernia without obstruction or gangrene: Secondary | ICD-10-CM | POA: Diagnosis not present

## 2020-01-13 DIAGNOSIS — E860 Dehydration: Secondary | ICD-10-CM | POA: Diagnosis not present

## 2020-01-13 DIAGNOSIS — N179 Acute kidney failure, unspecified: Secondary | ICD-10-CM | POA: Diagnosis not present

## 2020-02-01 DIAGNOSIS — D519 Vitamin B12 deficiency anemia, unspecified: Secondary | ICD-10-CM | POA: Diagnosis not present

## 2020-03-03 DIAGNOSIS — D519 Vitamin B12 deficiency anemia, unspecified: Secondary | ICD-10-CM | POA: Diagnosis not present

## 2020-04-01 DIAGNOSIS — Z23 Encounter for immunization: Secondary | ICD-10-CM | POA: Diagnosis not present

## 2020-04-01 DIAGNOSIS — E1165 Type 2 diabetes mellitus with hyperglycemia: Secondary | ICD-10-CM | POA: Diagnosis not present

## 2020-04-01 DIAGNOSIS — D519 Vitamin B12 deficiency anemia, unspecified: Secondary | ICD-10-CM | POA: Diagnosis not present

## 2020-04-01 DIAGNOSIS — E785 Hyperlipidemia, unspecified: Secondary | ICD-10-CM | POA: Diagnosis not present

## 2020-04-01 DIAGNOSIS — D509 Iron deficiency anemia, unspecified: Secondary | ICD-10-CM | POA: Diagnosis not present

## 2020-04-01 DIAGNOSIS — E1129 Type 2 diabetes mellitus with other diabetic kidney complication: Secondary | ICD-10-CM | POA: Diagnosis not present

## 2020-04-04 DIAGNOSIS — D519 Vitamin B12 deficiency anemia, unspecified: Secondary | ICD-10-CM | POA: Diagnosis not present

## 2020-04-04 DIAGNOSIS — E039 Hypothyroidism, unspecified: Secondary | ICD-10-CM | POA: Diagnosis not present

## 2020-04-04 DIAGNOSIS — E785 Hyperlipidemia, unspecified: Secondary | ICD-10-CM | POA: Diagnosis not present

## 2020-04-04 DIAGNOSIS — E1129 Type 2 diabetes mellitus with other diabetic kidney complication: Secondary | ICD-10-CM | POA: Diagnosis not present

## 2020-04-04 DIAGNOSIS — D509 Iron deficiency anemia, unspecified: Secondary | ICD-10-CM | POA: Diagnosis not present

## 2020-05-05 DIAGNOSIS — E039 Hypothyroidism, unspecified: Secondary | ICD-10-CM | POA: Diagnosis not present

## 2020-05-23 DIAGNOSIS — R197 Diarrhea, unspecified: Secondary | ICD-10-CM | POA: Diagnosis not present

## 2020-05-25 DIAGNOSIS — R197 Diarrhea, unspecified: Secondary | ICD-10-CM | POA: Diagnosis not present

## 2020-05-26 DIAGNOSIS — Z0000 Encounter for general adult medical examination without abnormal findings: Secondary | ICD-10-CM | POA: Diagnosis not present

## 2020-05-26 DIAGNOSIS — E785 Hyperlipidemia, unspecified: Secondary | ICD-10-CM | POA: Diagnosis not present

## 2020-05-26 DIAGNOSIS — Z9181 History of falling: Secondary | ICD-10-CM | POA: Diagnosis not present

## 2020-06-02 DIAGNOSIS — E039 Hypothyroidism, unspecified: Secondary | ICD-10-CM | POA: Diagnosis not present

## 2020-06-14 DIAGNOSIS — E119 Type 2 diabetes mellitus without complications: Secondary | ICD-10-CM | POA: Diagnosis not present

## 2020-06-17 NOTE — Progress Notes (Incomplete)
Patient ID: Melissa Jackson, female   DOB: [birth year], 80 y.o.   MRN: [id number]     Cardiology Office Note   Date:  06/17/2020   ID:  Yoneko Talerico, DOB Dec 22, 1940, MRN [id number]  PCP:  [name], MD  Cardiologist:   Jenkins Rouge, MD   No chief complaint on file.     History of Present Illness:  80 y.o. f/u CAD. Lives in [place] and sees [name] as primary. Previous patient of [name]. September 2014 presented with angina DES to LAD and Ramus with POB of D1 EF 55% PAF with SSS. Maintaining NSR on amiodarone Beta blocker dose has been decreased due to low HR's. Intolerant to statin with muscle wasting and pain Cough with ACE but tolerates ARB for BP control.   Admitted to hospital 03/18/19 with weakness and confusion UTI started on cipro 03/16/19 BS low 51 Rx dextrose Synthroid dose reduced for suppressed TSH  She seems to have more advanced dementia and not very interactive during interview Husband takes good care of her and administers her meds now    Has had COVID vaccine  No palpitations chest pain dyspnea or bleeding issues   PFTls and LFTS ok July 2021 On thyroid replacement for hypothyroidism per primary   ***   Past Medical History:  Diagnosis Date  . Acute myocardial infarction, subendocardial infarction, initial episode of care ([place])   . Anemia   . Anticoagulated    ON PRADAXA  . Atrial fibrillation ([place])   . Coronary artery disease   . Diabetes mellitus with no complication ([place])   . Disease of tricuspid valve    MILD  . Fall   . First degree atrioventricular block   . Head ache   . High risk medication use   . History of total hysterectomy   . Hyperlipidemia    SEVERE  . Hypertension, essential   . Hypothyroidism   . Left atrial enlargement    MILD  . Mitral valvular disorder    MILD-MODERATE  . Old myocardial infarction    JAN 2008  . Takotsubo syndrome     Past Surgical History:  Procedure Laterality Date  . APPENDECTOMY    .  COLONOSCOPY  03/28/2005   Moderate left colonic diverticulosis. Internal hemorrhoids.  . INCONTINENCE SURGERY    . NO PAST SURGERIES    . TOTAL ABDOMINAL HYSTERECTOMY       Current Outpatient Medications  Medication Sig Dispense Refill  . alendronate (FOSAMAX) 70 MG tablet Take 70 mg by mouth once a week. Takes on fridays    . amiodarone (PACERONE) 200 MG tablet Take 200 mg by mouth daily.    Marland Kitchen amLODipine (NORVASC) 5 MG tablet Take 5 mg by mouth daily.    Marland Kitchen aspirin 81 MG tablet Take 81 mg by mouth daily.    . chlorthalidone (HYGROTON) 25 MG tablet Take 25 mg by mouth every morning.    . clopidogrel (PLAVIX) 75 MG tablet Take 75 mg by mouth daily.    . ferrous sulfate 325 (65 FE) MG tablet Take 325 mg by mouth 3 (three) times daily with meals.    Marland Kitchen levothyroxine (SYNTHROID) 112 MCG tablet Take 1 tablet (112 mcg total) by mouth daily before breakfast. 30 tablet 0  . losartan (COZAAR) 25 MG tablet Take 1 tablet by mouth once daily 90 tablet 3  . metFORMIN (GLUCOPHAGE) 1000 MG tablet Take 1,000 mg by mouth 2 (two) times daily.    . metoprolol  tartrate (LOPRESSOR) 25 MG tablet Take 0.5 tablets (12.5 mg total) by mouth 2 (two) times daily. 30 tablet 0  . Multiple Vitamins-Minerals (MULTIVITAMIN WITH MINERALS) tablet Take 1 tablet by mouth daily.    . nitroGLYCERIN (NITROSTAT) 0.4 MG SL tablet Place 0.4 mg under the tongue every 5 (five) minutes as needed for chest pain.    . Omega 3 1000 MG CAPS Take 2 capsules by mouth 2 (two) times daily.    . pravastatin (PRAVACHOL) 20 MG tablet as directed.  5  . Red Yeast Rice 600 MG CAPS Take 1 capsule by mouth daily.      No current facility-administered medications for this visit.    Allergies:   Lisinopril, Onion, Codeine, Morphine and related, and Statins    Social History:  The patient  reports that she has never smoked. She has never used smokeless tobacco. She reports that she does not drink alcohol and does not use drugs.   Family History:   The patient's family history includes Alzheimer's disease in her mother; Diabetes Mellitus I in her mother; Heart attack in her father.    ROS:  Please see the history of present illness.   Otherwise, review of systems are positive for none.   All other systems are reviewed and negative.    PHYSICAL EXAM: VS:  LMP  (LMP Unknown)  , BMI There is no height or weight on file to calculate BMI. Affect appropriate Healthy:  appears stated age 82: normal Neck supple with no adenopathy JVP normal no bruits no thyromegaly Lungs clear with no wheezing and good diaphragmatic motion Heart:  S1/S2 no murmur, no rub, gallop or click PMI normal Abdomen: benighn, BS positve, no tenderness, no AAA no bruit.  No HSM or HJR Distal pulses intact with no bruits No edema Neuro non-focal Skin warm and dry No muscular weakness   EKG: 10/10/17 SR rate 58 nonspecific ST changes    Recent Labs: No results found for requested labs within last 8760 hours.    Lipid Panel No results found for: CHOL, TRIG, HDL, CHOLHDL, VLDL, LDLCALC, LDLDIRECT    Wt Readings from Last 3 Encounters:  12/11/19 48.1 kg  05/14/19 48.5 kg  03/18/19 49.2 kg      Other studies Reviewed: Additional studies/ records that were reviewed today include: Records from [place], [place], [place] and [place] .    ASSESSMENT AND PLAN:  1. CAD:  Given complexity of intervention 2014 continue DAT.  No angina medical Rx 2. PAF:  Continue low dose amiodarone. PFTls,/LFTls ok 12/16/19  3. SSS:  Lower dose lopressor to 25 bid and f/u no syncopal symptoms or HB 4. Chol:  Discussed taking 1200 mg red yeast rice daly   5. HTN:  With DM and significant CAD on ARB tolerating well Significant white coat component She assures me home readings are fine always high in our office  6. Thyroid TSH suppressed .051 03/18/19 f/u Dr Delena Bali suggest lower dose  On d/c 112 ug daily  7. Confusion: related to low BS and UTI resolved   Current medicines  are reviewed at length with the patient today.  The patient does not have concerns regarding medicines.  ***  F/U in 6 months   Signed, Jenkins Rouge, MD  06/17/2020 11:09 AM    [facility] [place], [place], [place]  [address] Phone: [phone]; Fax: [phone]

## 2020-06-24 ENCOUNTER — Ambulatory Visit: Payer: Medicare Other | Admitting: Cardiovascular Disease

## 2020-07-04 DIAGNOSIS — E785 Hyperlipidemia, unspecified: Secondary | ICD-10-CM | POA: Diagnosis not present

## 2020-07-04 DIAGNOSIS — E039 Hypothyroidism, unspecified: Secondary | ICD-10-CM | POA: Diagnosis not present

## 2020-07-04 DIAGNOSIS — D509 Iron deficiency anemia, unspecified: Secondary | ICD-10-CM | POA: Diagnosis not present

## 2020-07-04 DIAGNOSIS — Z139 Encounter for screening, unspecified: Secondary | ICD-10-CM | POA: Diagnosis not present

## 2020-07-04 DIAGNOSIS — D519 Vitamin B12 deficiency anemia, unspecified: Secondary | ICD-10-CM | POA: Diagnosis not present

## 2020-07-04 DIAGNOSIS — I10 Essential (primary) hypertension: Secondary | ICD-10-CM | POA: Diagnosis not present

## 2020-07-04 DIAGNOSIS — E1129 Type 2 diabetes mellitus with other diabetic kidney complication: Secondary | ICD-10-CM | POA: Diagnosis not present

## 2020-07-04 DIAGNOSIS — M810 Age-related osteoporosis without current pathological fracture: Secondary | ICD-10-CM | POA: Diagnosis not present

## 2020-07-04 DIAGNOSIS — E1165 Type 2 diabetes mellitus with hyperglycemia: Secondary | ICD-10-CM | POA: Diagnosis not present

## 2020-07-15 ENCOUNTER — Other Ambulatory Visit: Payer: Self-pay | Admitting: Cardiovascular Disease

## 2020-08-02 DIAGNOSIS — D519 Vitamin B12 deficiency anemia, unspecified: Secondary | ICD-10-CM | POA: Diagnosis not present

## 2020-08-17 DIAGNOSIS — E039 Hypothyroidism, unspecified: Secondary | ICD-10-CM | POA: Diagnosis not present

## 2020-08-29 IMAGING — DX DG CHEST 1V PORT
1 series · 1 of 1 positions shown · non-contrast
Comparison: Chest x-ray 02/21/2013.

CLINICAL DATA: 78-year-old female with history of altered mental
status.

EXAM:
PORTABLE CHEST 1 VIEW

[chest ap]
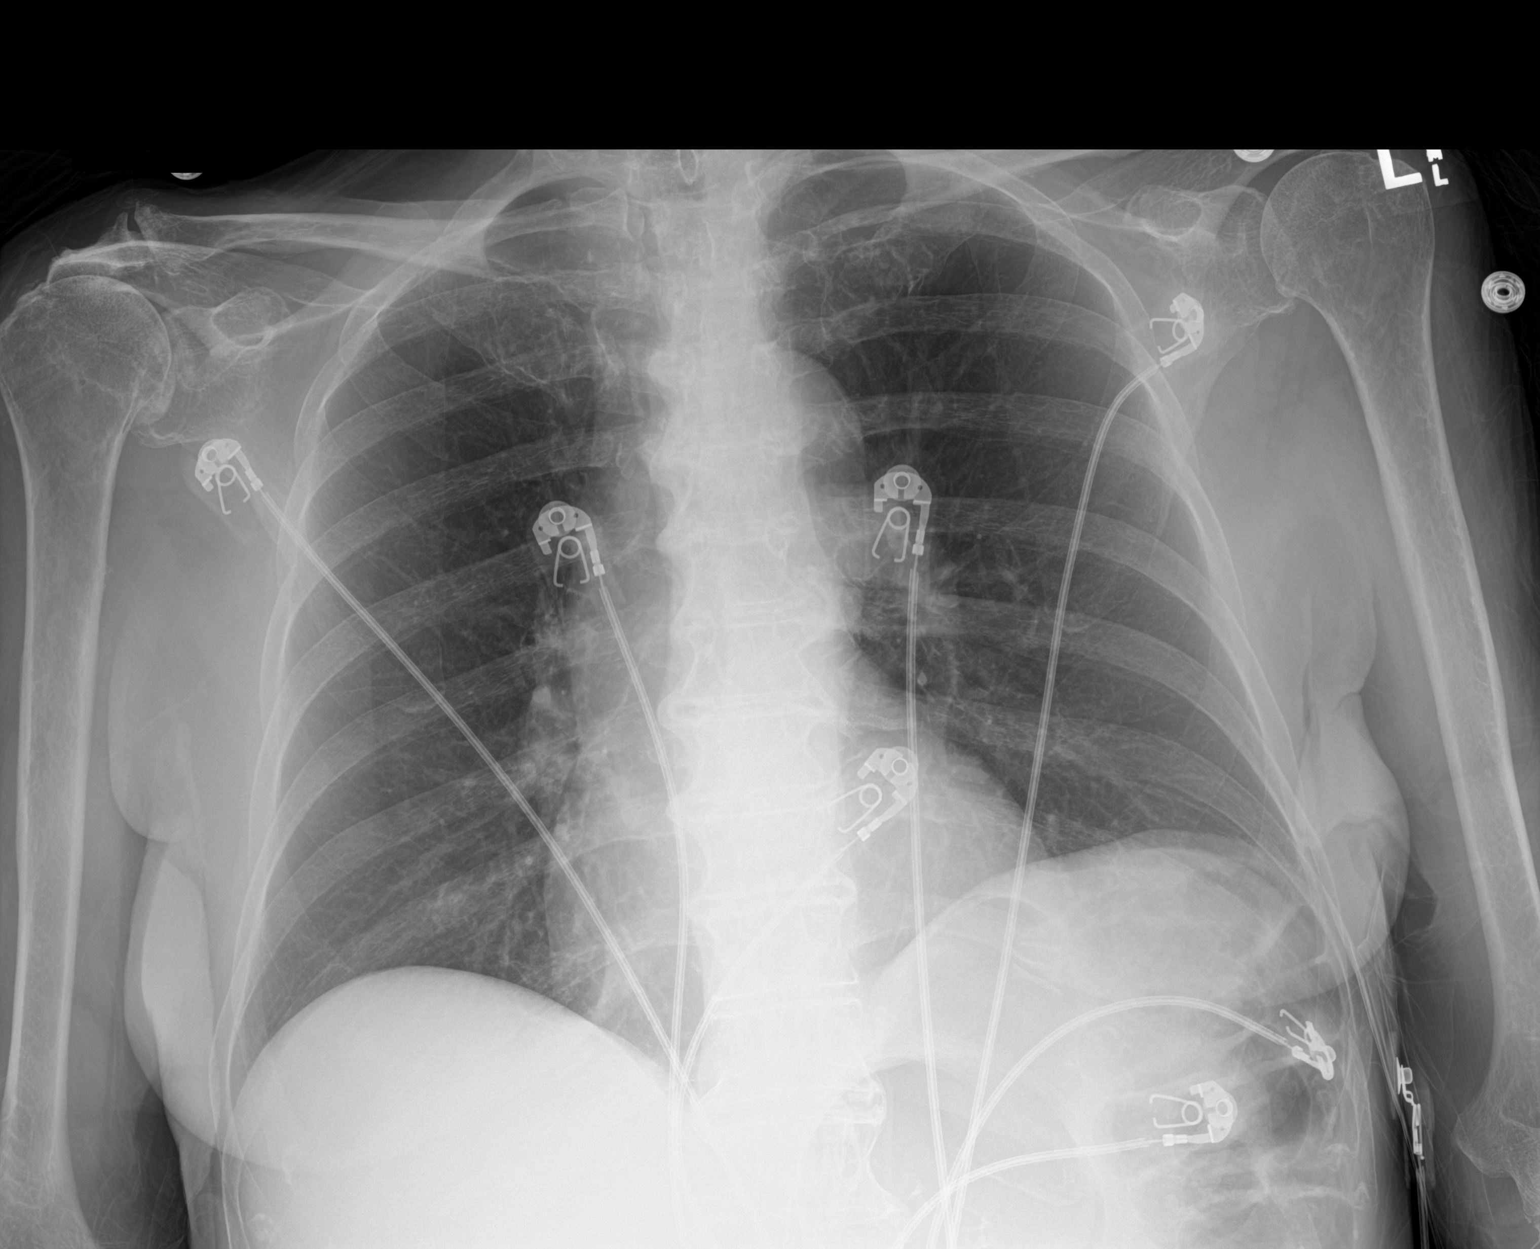

[1 of 1 positions shown; findings below may reference images not displayed]

FINDINGS: Lung volumes are normal. No consolidative airspace disease. No
pleural effusions. No pneumothorax. No pulmonary nodule or mass
noted. Pulmonary vasculature and the cardiomediastinal silhouette
are within normal limits. Atherosclerosis in the thoracic aorta.
IMPRESSION: 1.  No radiographic evidence of acute cardiopulmonary disease.
2. Aortic atherosclerosis.

## 2020-08-29 IMAGING — CT CT HEAD W/O CM
3 series · 14 of 46 positions shown, 16 images · non-contrast
Comparison: 11/29/2011

CLINICAL DATA: "Pt from home. Pt is more altered this am than
usually. Pts CBG was 51. EMS gave 25 g D10. Last cbg 196 on arrival.
Pt takes glipizide for diabetes. Pt dx with UTI on [REDACTED]. Pt taking
cipro."

EXAM:
CT HEAD WITHOUT CONTRAST
TECHNIQUE: Contiguous axial images were obtained from the base of the skull
through the vertex without intravenous contrast.

[Series 2: head wo · axial · 0.47mm/px · z∈[+1309,+1429]mm · 8 of 29 slices shown, 10 images]
[im 3/29  brain]
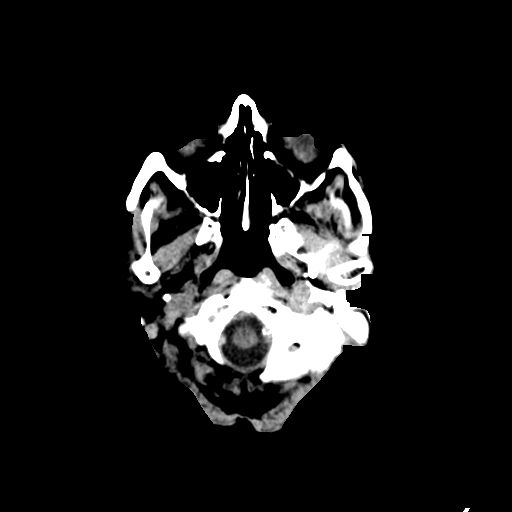
[im 3/29  bone]
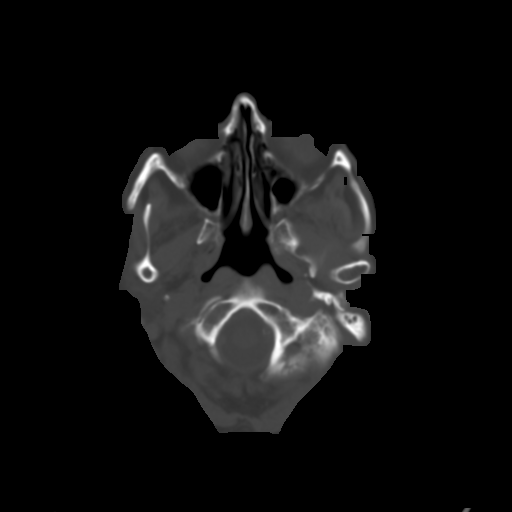
[im 7/29  brain]
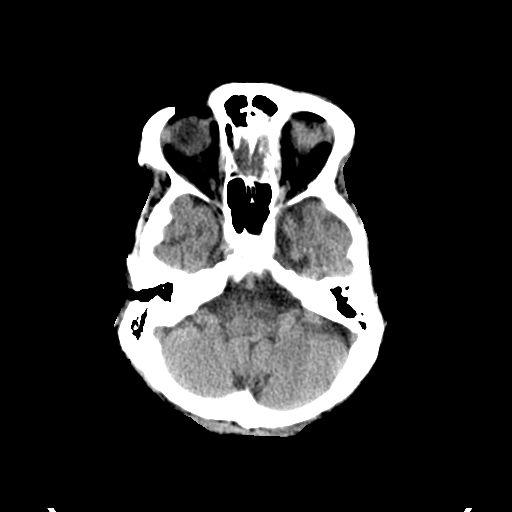
[im 10/29  brain]
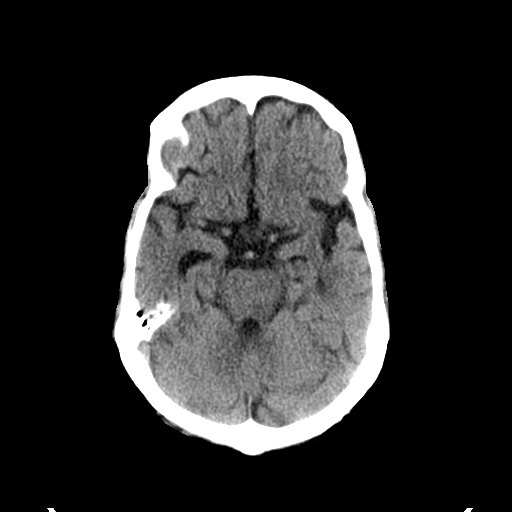
[im 13/29  brain]
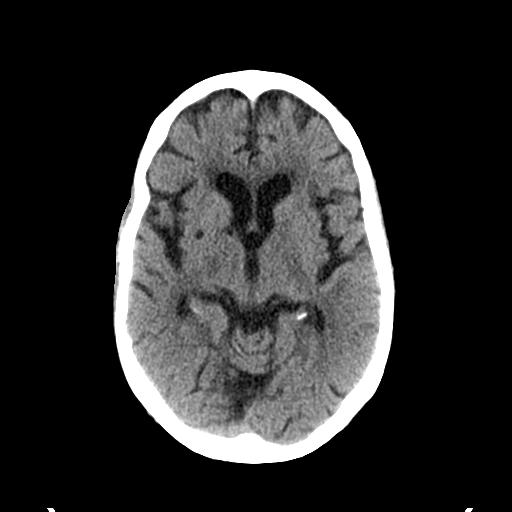
[im 17/29  brain]
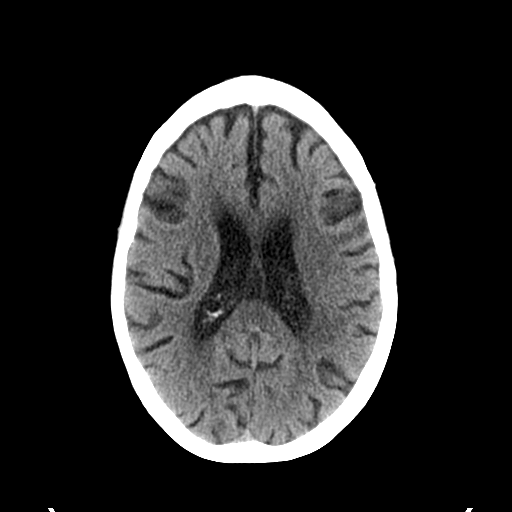
[im 17/29  bone]
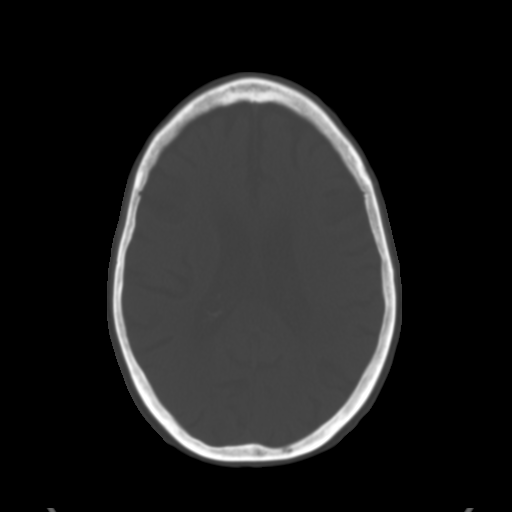
[im 20/29  brain]
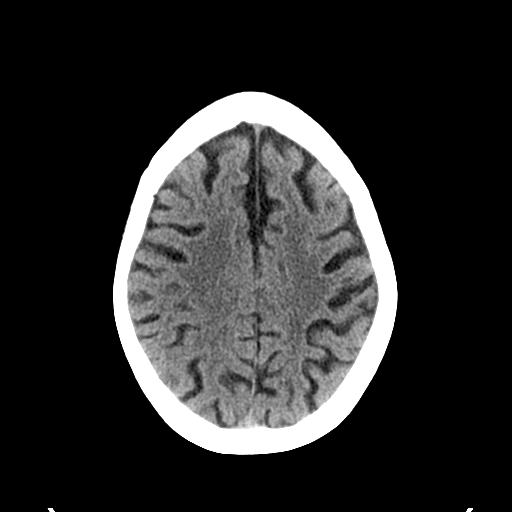
[im 23/29  brain]
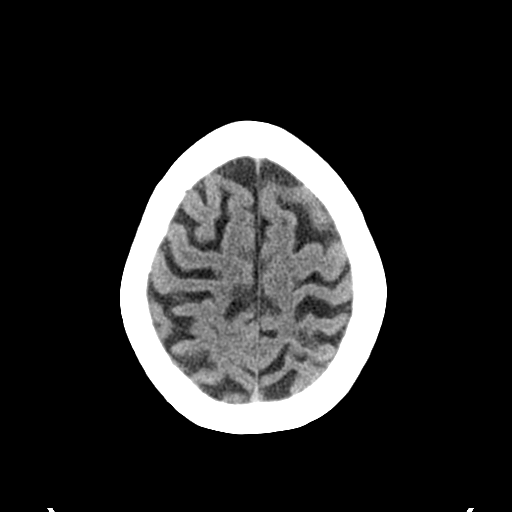
[im 27/29  brain]
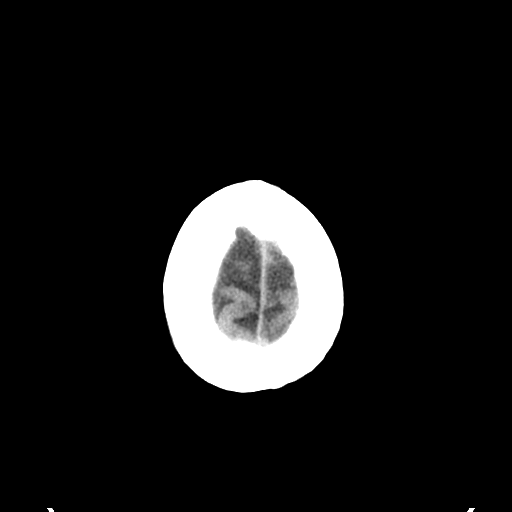

[Series 4: coronal soft tissue · coronal · 0.34mm/px · 3 of 61 slices shown]
[im 21/61  brain]
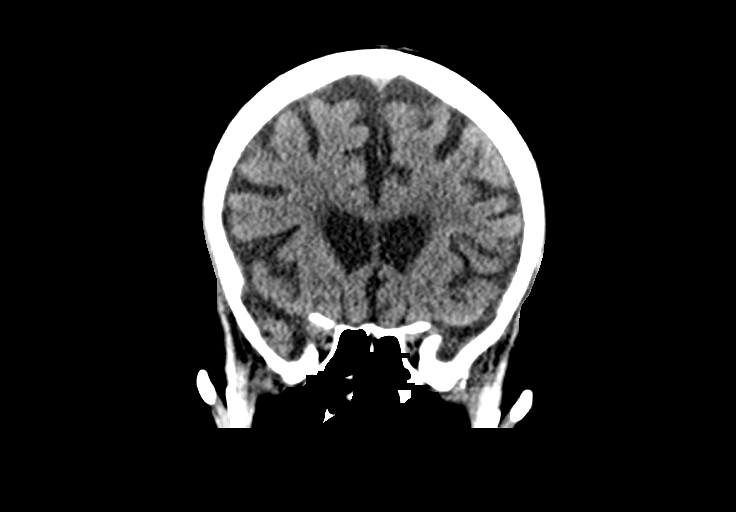
[im 27/61  brain]
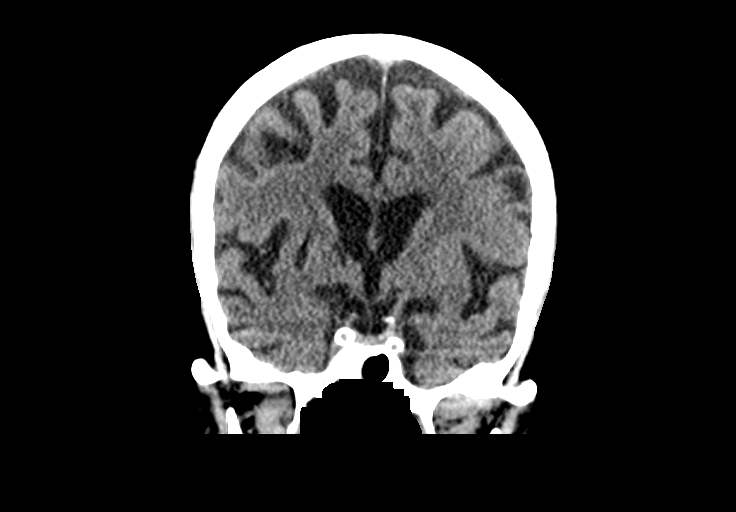
[im 34/61  brain]
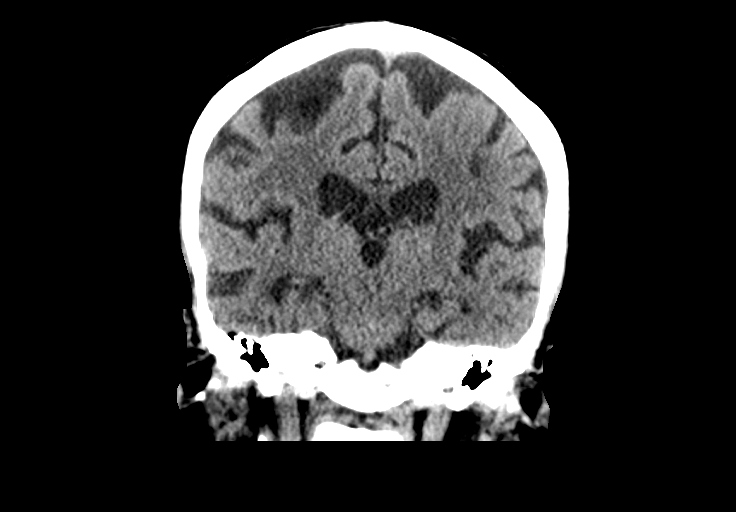

[Series 5: sagittal soft tissue · sagittal · 0.38mm/px · 3 of 45 slices shown]
[im 15/45  brain]
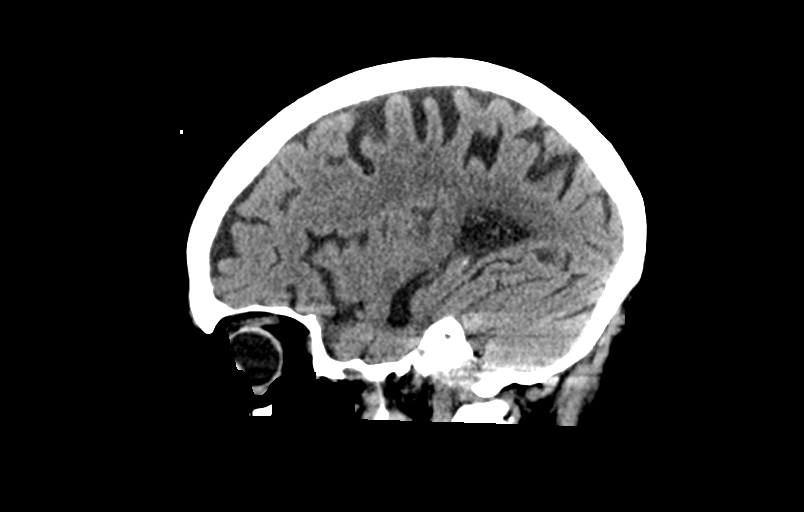
[im 23/45  brain]
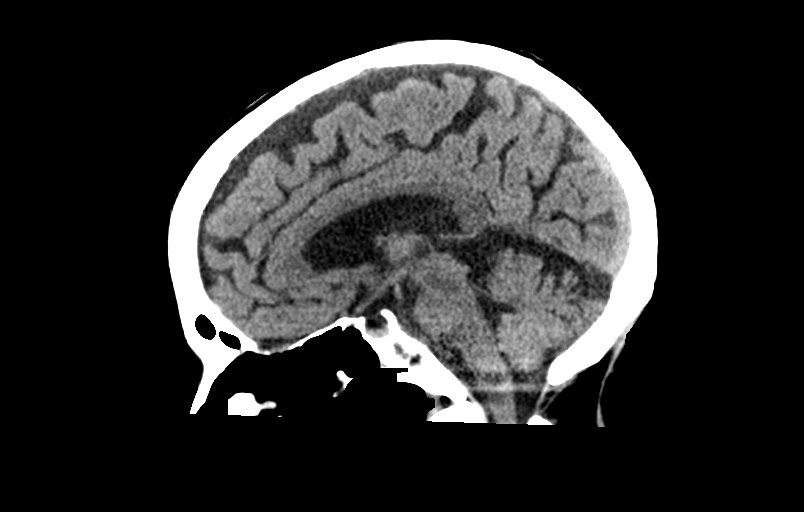
[im 30/45  brain]
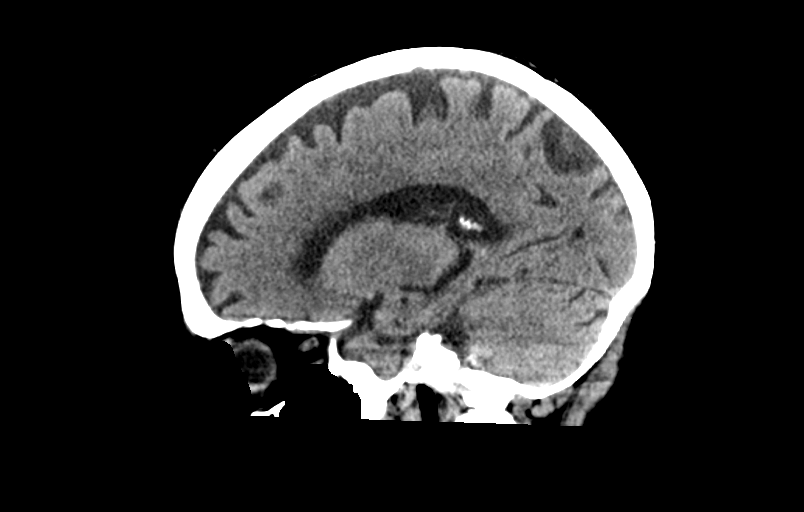

[14 of 46 positions shown; findings below may reference images not displayed]

FINDINGS: Brain: No evidence of acute infarction, hemorrhage, hydrocephalus,
extra-axial collection or mass lesion/mass effect.

There is age appropriate ventricular and sulcal enlargement. Old
lacunar infarct noted in the right basal ganglia. Bilateral patchy
white matter hypoattenuation is present consistent with mild chronic
microvascular ischemic change.

Vascular: No hyperdense vessel or unexpected calcification.

Skull: Normal. Negative for fracture or focal lesion.

Sinuses/Orbits: Globes and orbits are unremarkable. Visualized
sinuses and mastoid air cells are clear.

Other: None.
IMPRESSION: 1. No acute intracranial abnormalities.
2. Age related volume loss. Mild chronic microvascular ischemic
change.

## 2020-09-01 NOTE — Progress Notes (Signed)
Patient ID: Melissa Jackson, female   DOB: 1941-02-16, 80 y.o.   MRN: [id number]     Cardiology Office Note   Date:  09/09/2020   ID:  Melissa Jackson, DOB June 12, 1940, MRN [id number]  PCP:  [name], MD  Cardiologist:   Jenkins Rouge, MD   No chief complaint on file.     History of Present Illness:  80 y.o. f/u CAD. Lives in [place] and sees [name] as primary. Previous patient of [name]. September 2014 presented with angina DES to LAD and Ramus with POB of D1 EF 55% PAF with SSS. Maintaining NSR on amiodarone Beta blocker dose has been decreased due to low HR's. Intolerant to statin with muscle wasting and pain Cough with ACE but tolerates ARB for BP control.   Admitted to hospital 03/18/19 with weakness and confusion UTI started on cipro 03/16/19 BS low 51 Rx dextrose Synthroid dose reduced for suppressed TSH  She seems to have more advanced dementia  Husband stopped her ASA due to nightly stomach upset  Past Medical History:  Diagnosis Date  . Acute myocardial infarction, subendocardial infarction, initial episode of care ([place])   . Anemia   . Anticoagulated    ON PRADAXA  . Atrial fibrillation ([place])   . Coronary artery disease   . Diabetes mellitus with no complication ([place])   . Disease of tricuspid valve    MILD  . Fall   . First degree atrioventricular block   . Head ache   . High risk medication use   . History of total hysterectomy   . Hyperlipidemia    SEVERE  . Hypertension, essential   . Hypothyroidism   . Left atrial enlargement    MILD  . Mitral valvular disorder    MILD-MODERATE  . Old myocardial infarction    JAN 2008  . Takotsubo syndrome     Past Surgical History:  Procedure Laterality Date  . APPENDECTOMY    . COLONOSCOPY  03/28/2005   Moderate left colonic diverticulosis. Internal hemorrhoids.  . INCONTINENCE SURGERY    . NO PAST SURGERIES    . TOTAL ABDOMINAL HYSTERECTOMY       Current Outpatient Medications  Medication Sig  Dispense Refill  . alendronate (FOSAMAX) 70 MG tablet Take 70 mg by mouth once a week. Takes on fridays    . amiodarone (PACERONE) 200 MG tablet Take 200 mg by mouth daily.    Marland Kitchen amLODipine (NORVASC) 5 MG tablet Take 5 mg by mouth daily.    . chlorthalidone (HYGROTON) 25 MG tablet Take 25 mg by mouth every morning.    . clopidogrel (PLAVIX) 75 MG tablet Take 75 mg by mouth daily.    . ferrous sulfate 325 (65 FE) MG tablet Take 325 mg by mouth 3 (three) times daily with meals.    Marland Kitchen levothyroxine (SYNTHROID) 112 MCG tablet Take 1 tablet (112 mcg total) by mouth daily before breakfast. 30 tablet 0  . losartan (COZAAR) 25 MG tablet Take 1 tablet by mouth once daily 90 tablet 1  . metFORMIN (GLUCOPHAGE) 1000 MG tablet Take 1,000 mg by mouth 2 (two) times daily.    . metoprolol tartrate (LOPRESSOR) 25 MG tablet Take 0.5 tablets (12.5 mg total) by mouth 2 (two) times daily. 30 tablet 0  . Multiple Vitamins-Minerals (MULTIVITAMIN WITH MINERALS) tablet Take 1 tablet by mouth daily.    . nitroGLYCERIN (NITROSTAT) 0.4 MG SL tablet Place 0.4 mg under the tongue every 5 (five) minutes as needed  for chest pain.    . Omega 3 1000 MG CAPS Take 2 capsules by mouth 2 (two) times daily.    . pravastatin (PRAVACHOL) 20 MG tablet as directed.  5  . Red Yeast Rice 600 MG CAPS Take 1 capsule by mouth daily.      No current facility-administered medications for this visit.    Allergies:   Lisinopril, Onion, Codeine, Morphine and related, and Statins    Social History:  The patient  reports that she has never smoked. She has never used smokeless tobacco. She reports that she does not drink alcohol and does not use drugs.   Family History:  The patient's family history includes Alzheimer's disease in her mother; Diabetes Mellitus I in her mother; Heart attack in her father.    ROS:  Please see the history of present illness.   Otherwise, review of systems are positive for none.   All other systems are reviewed and  negative.    PHYSICAL EXAM: VS:  BP (!) 150/78   Pulse (!) 54   Ht 5\' 4"  (1.626 m)   Wt 45.1 kg   LMP  (LMP Unknown)   SpO2 98%   BMI 17.06 kg/m  , BMI Body mass index is 17.06 kg/m. Affect appropriate Healthy:  appears stated age 74: normal Neck supple with no adenopathy JVP normal no bruits no thyromegaly Lungs clear with no wheezing and good diaphragmatic motion Heart:  S1/S2 no murmur, no rub, gallop or click PMI normal Abdomen: benighn, BS positve, no tenderness, no AAA no bruit.  No HSM or HJR Distal pulses intact with no bruits No edema Neuro non-focal Skin warm and dry No muscular weakness   EKG: 10/10/17 SR rate 58 nonspecific ST changes 09/09/2020 SR rate 54 inferior lateral T wave changes    Recent Labs: No results found for requested labs within last 8760 hours.    Lipid Panel No results found for: CHOL, TRIG, HDL, CHOLHDL, VLDL, LDLCALC, LDLDIRECT    Wt Readings from Last 3 Encounters:  09/09/20 45.1 kg  12/11/19 48.1 kg  05/14/19 48.5 kg      Other studies Reviewed: Additional studies/ records that were reviewed today include: Records from [place], [place], [place] and [place] .    ASSESSMENT AND PLAN:  1. CAD:  Given complexity of intervention 2014 continue DAT.  No angina medical Rx 2. PAF:  Continue low dose amiodarone. LFT;s/TSH normal 07/2020  PFTls/ DLCO normal 12/16/19  3. SSS:  Lower dose lopressor to 25 bid and f/u no syncopal symptoms or HB 4. Chol:  Discussed taking 1200 mg red yeast rice daly   5. HTN:  With DM and significant CAD on ARB tolerating well Significant white coat component She assures me home readings are fine always high in our office  6. Thyroid TSH normal 07/2020   7. Confusion: related to low BS and UTI resolved   F/U in 6 months   Signed, Jenkins Rouge, MD  09/09/2020 3:54 PM    [facility] [place], [place], [place]  [address] Phone: [phone]; Fax: [phone]

## 2020-09-02 DIAGNOSIS — D519 Vitamin B12 deficiency anemia, unspecified: Secondary | ICD-10-CM | POA: Diagnosis not present

## 2020-09-09 ENCOUNTER — Ambulatory Visit: Payer: Medicare Other | Admitting: Cardiovascular Disease

## 2020-09-09 ENCOUNTER — Other Ambulatory Visit: Payer: Self-pay

## 2020-09-09 ENCOUNTER — Encounter: Payer: Self-pay | Admitting: Cardiovascular Disease

## 2020-09-09 VITALS — BP 150/78 | HR 54 | Ht 64.0 in | Wt 99.4 lb

## 2020-09-09 DIAGNOSIS — I480 Paroxysmal atrial fibrillation: Secondary | ICD-10-CM | POA: Diagnosis not present

## 2020-09-09 DIAGNOSIS — I2510 Atherosclerotic heart disease of native coronary artery without angina pectoris: Secondary | ICD-10-CM

## 2020-09-09 NOTE — Patient Instructions (Signed)

## 2020-10-03 DIAGNOSIS — E039 Hypothyroidism, unspecified: Secondary | ICD-10-CM | POA: Diagnosis not present

## 2020-10-03 DIAGNOSIS — D519 Vitamin B12 deficiency anemia, unspecified: Secondary | ICD-10-CM | POA: Diagnosis not present

## 2020-10-03 DIAGNOSIS — E785 Hyperlipidemia, unspecified: Secondary | ICD-10-CM | POA: Diagnosis not present

## 2020-10-03 DIAGNOSIS — Z7689 Persons encountering health services in other specified circumstances: Secondary | ICD-10-CM | POA: Diagnosis not present

## 2020-10-03 DIAGNOSIS — M810 Age-related osteoporosis without current pathological fracture: Secondary | ICD-10-CM | POA: Diagnosis not present

## 2020-10-03 DIAGNOSIS — D509 Iron deficiency anemia, unspecified: Secondary | ICD-10-CM | POA: Diagnosis not present

## 2020-10-03 DIAGNOSIS — M25512 Pain in left shoulder: Secondary | ICD-10-CM | POA: Diagnosis not present

## 2020-10-03 DIAGNOSIS — I10 Essential (primary) hypertension: Secondary | ICD-10-CM | POA: Diagnosis not present

## 2020-10-05 DIAGNOSIS — M25512 Pain in left shoulder: Secondary | ICD-10-CM | POA: Diagnosis not present

## 2020-10-05 DIAGNOSIS — M19012 Primary osteoarthritis, left shoulder: Secondary | ICD-10-CM | POA: Diagnosis not present

## 2020-10-05 DIAGNOSIS — G8929 Other chronic pain: Secondary | ICD-10-CM | POA: Diagnosis not present

## 2020-11-01 DIAGNOSIS — G8929 Other chronic pain: Secondary | ICD-10-CM | POA: Diagnosis not present

## 2020-11-01 DIAGNOSIS — M19012 Primary osteoarthritis, left shoulder: Secondary | ICD-10-CM | POA: Diagnosis not present

## 2020-11-01 DIAGNOSIS — M25512 Pain in left shoulder: Secondary | ICD-10-CM | POA: Diagnosis not present

## 2020-11-04 DIAGNOSIS — Z681 Body mass index (BMI) 19 or less, adult: Secondary | ICD-10-CM | POA: Diagnosis not present

## 2020-11-04 DIAGNOSIS — S069X9A Unspecified intracranial injury with loss of consciousness of unspecified duration, initial encounter: Secondary | ICD-10-CM | POA: Diagnosis not present

## 2020-11-04 DIAGNOSIS — R296 Repeated falls: Secondary | ICD-10-CM | POA: Diagnosis not present

## 2020-11-04 DIAGNOSIS — Z888 Allergy status to other drugs, medicaments and biological substances status: Secondary | ICD-10-CM | POA: Diagnosis not present

## 2020-11-04 DIAGNOSIS — Z743 Need for continuous supervision: Secondary | ICD-10-CM | POA: Diagnosis not present

## 2020-11-04 DIAGNOSIS — E039 Hypothyroidism, unspecified: Secondary | ICD-10-CM | POA: Diagnosis not present

## 2020-11-04 DIAGNOSIS — I482 Chronic atrial fibrillation, unspecified: Secondary | ICD-10-CM | POA: Diagnosis not present

## 2020-11-04 DIAGNOSIS — Z7901 Long term (current) use of anticoagulants: Secondary | ICD-10-CM | POA: Diagnosis not present

## 2020-11-04 DIAGNOSIS — N178 Other acute kidney failure: Secondary | ICD-10-CM | POA: Diagnosis not present

## 2020-11-04 DIAGNOSIS — S0990XA Unspecified injury of head, initial encounter: Secondary | ICD-10-CM | POA: Diagnosis not present

## 2020-11-04 DIAGNOSIS — Z87440 Personal history of urinary (tract) infections: Secondary | ICD-10-CM | POA: Diagnosis not present

## 2020-11-04 DIAGNOSIS — N281 Cyst of kidney, acquired: Secondary | ICD-10-CM | POA: Diagnosis not present

## 2020-11-04 DIAGNOSIS — E119 Type 2 diabetes mellitus without complications: Secondary | ICD-10-CM | POA: Diagnosis not present

## 2020-11-04 DIAGNOSIS — Z8673 Personal history of transient ischemic attack (TIA), and cerebral infarction without residual deficits: Secondary | ICD-10-CM | POA: Diagnosis not present

## 2020-11-04 DIAGNOSIS — I639 Cerebral infarction, unspecified: Secondary | ICD-10-CM | POA: Diagnosis not present

## 2020-11-04 DIAGNOSIS — Z79899 Other long term (current) drug therapy: Secondary | ICD-10-CM | POA: Diagnosis not present

## 2020-11-04 DIAGNOSIS — E440 Moderate protein-calorie malnutrition: Secondary | ICD-10-CM | POA: Diagnosis not present

## 2020-11-04 DIAGNOSIS — S20419A Abrasion of unspecified back wall of thorax, initial encounter: Secondary | ICD-10-CM | POA: Diagnosis not present

## 2020-11-04 DIAGNOSIS — S0003XA Contusion of scalp, initial encounter: Secondary | ICD-10-CM | POA: Diagnosis not present

## 2020-11-04 DIAGNOSIS — R269 Unspecified abnormalities of gait and mobility: Secondary | ICD-10-CM | POA: Diagnosis not present

## 2020-11-04 DIAGNOSIS — M549 Dorsalgia, unspecified: Secondary | ICD-10-CM | POA: Diagnosis not present

## 2020-11-04 DIAGNOSIS — I252 Old myocardial infarction: Secondary | ICD-10-CM | POA: Diagnosis not present

## 2020-11-04 DIAGNOSIS — I2510 Atherosclerotic heart disease of native coronary artery without angina pectoris: Secondary | ICD-10-CM | POA: Diagnosis not present

## 2020-11-04 DIAGNOSIS — E785 Hyperlipidemia, unspecified: Secondary | ICD-10-CM | POA: Diagnosis not present

## 2020-11-04 DIAGNOSIS — S199XXA Unspecified injury of neck, initial encounter: Secondary | ICD-10-CM | POA: Diagnosis not present

## 2020-11-04 DIAGNOSIS — M25519 Pain in unspecified shoulder: Secondary | ICD-10-CM | POA: Diagnosis not present

## 2020-11-04 DIAGNOSIS — M47812 Spondylosis without myelopathy or radiculopathy, cervical region: Secondary | ICD-10-CM | POA: Diagnosis not present

## 2020-11-04 DIAGNOSIS — I6522 Occlusion and stenosis of left carotid artery: Secondary | ICD-10-CM | POA: Diagnosis not present

## 2020-11-04 DIAGNOSIS — I10 Essential (primary) hypertension: Secondary | ICD-10-CM | POA: Diagnosis not present

## 2020-11-04 DIAGNOSIS — R6889 Other general symptoms and signs: Secondary | ICD-10-CM | POA: Diagnosis not present

## 2020-11-04 DIAGNOSIS — Z66 Do not resuscitate: Secondary | ICD-10-CM | POA: Diagnosis not present

## 2020-11-04 DIAGNOSIS — R531 Weakness: Secondary | ICD-10-CM | POA: Diagnosis not present

## 2020-11-04 DIAGNOSIS — N179 Acute kidney failure, unspecified: Secondary | ICD-10-CM | POA: Diagnosis not present

## 2020-11-04 DIAGNOSIS — E871 Hypo-osmolality and hyponatremia: Secondary | ICD-10-CM | POA: Diagnosis not present

## 2020-11-04 DIAGNOSIS — R001 Bradycardia, unspecified: Secondary | ICD-10-CM | POA: Diagnosis not present

## 2020-11-11 DIAGNOSIS — Z7984 Long term (current) use of oral hypoglycemic drugs: Secondary | ICD-10-CM | POA: Diagnosis not present

## 2020-11-11 DIAGNOSIS — Z7902 Long term (current) use of antithrombotics/antiplatelets: Secondary | ICD-10-CM | POA: Diagnosis not present

## 2020-11-11 DIAGNOSIS — I480 Paroxysmal atrial fibrillation: Secondary | ICD-10-CM | POA: Diagnosis not present

## 2020-11-11 DIAGNOSIS — E119 Type 2 diabetes mellitus without complications: Secondary | ICD-10-CM | POA: Diagnosis not present

## 2020-11-11 DIAGNOSIS — E46 Unspecified protein-calorie malnutrition: Secondary | ICD-10-CM | POA: Diagnosis not present

## 2020-11-11 DIAGNOSIS — Z9181 History of falling: Secondary | ICD-10-CM | POA: Diagnosis not present

## 2020-11-11 DIAGNOSIS — N179 Acute kidney failure, unspecified: Secondary | ICD-10-CM | POA: Diagnosis not present

## 2020-11-11 DIAGNOSIS — I10 Essential (primary) hypertension: Secondary | ICD-10-CM | POA: Diagnosis not present

## 2020-11-11 DIAGNOSIS — I2510 Atherosclerotic heart disease of native coronary artery without angina pectoris: Secondary | ICD-10-CM | POA: Diagnosis not present

## 2020-11-11 DIAGNOSIS — E039 Hypothyroidism, unspecified: Secondary | ICD-10-CM | POA: Diagnosis not present

## 2020-11-11 DIAGNOSIS — E785 Hyperlipidemia, unspecified: Secondary | ICD-10-CM | POA: Diagnosis not present

## 2020-11-11 DIAGNOSIS — E871 Hypo-osmolality and hyponatremia: Secondary | ICD-10-CM | POA: Diagnosis not present

## 2020-11-11 DIAGNOSIS — S20419D Abrasion of unspecified back wall of thorax, subsequent encounter: Secondary | ICD-10-CM | POA: Diagnosis not present

## 2020-11-11 DIAGNOSIS — Z8673 Personal history of transient ischemic attack (TIA), and cerebral infarction without residual deficits: Secondary | ICD-10-CM | POA: Diagnosis not present

## 2020-11-11 DIAGNOSIS — I252 Old myocardial infarction: Secondary | ICD-10-CM | POA: Diagnosis not present

## 2020-11-11 DIAGNOSIS — Z87440 Personal history of urinary (tract) infections: Secondary | ICD-10-CM | POA: Diagnosis not present

## 2020-11-13 DIAGNOSIS — Z7984 Long term (current) use of oral hypoglycemic drugs: Secondary | ICD-10-CM | POA: Diagnosis not present

## 2020-11-13 DIAGNOSIS — I10 Essential (primary) hypertension: Secondary | ICD-10-CM | POA: Diagnosis not present

## 2020-11-13 DIAGNOSIS — Z9181 History of falling: Secondary | ICD-10-CM | POA: Diagnosis not present

## 2020-11-13 DIAGNOSIS — Z87440 Personal history of urinary (tract) infections: Secondary | ICD-10-CM | POA: Diagnosis not present

## 2020-11-13 DIAGNOSIS — E785 Hyperlipidemia, unspecified: Secondary | ICD-10-CM | POA: Diagnosis not present

## 2020-11-13 DIAGNOSIS — E039 Hypothyroidism, unspecified: Secondary | ICD-10-CM | POA: Diagnosis not present

## 2020-11-13 DIAGNOSIS — S20419D Abrasion of unspecified back wall of thorax, subsequent encounter: Secondary | ICD-10-CM | POA: Diagnosis not present

## 2020-11-13 DIAGNOSIS — I480 Paroxysmal atrial fibrillation: Secondary | ICD-10-CM | POA: Diagnosis not present

## 2020-11-13 DIAGNOSIS — E871 Hypo-osmolality and hyponatremia: Secondary | ICD-10-CM | POA: Diagnosis not present

## 2020-11-13 DIAGNOSIS — Z8673 Personal history of transient ischemic attack (TIA), and cerebral infarction without residual deficits: Secondary | ICD-10-CM | POA: Diagnosis not present

## 2020-11-13 DIAGNOSIS — I252 Old myocardial infarction: Secondary | ICD-10-CM | POA: Diagnosis not present

## 2020-11-13 DIAGNOSIS — E119 Type 2 diabetes mellitus without complications: Secondary | ICD-10-CM | POA: Diagnosis not present

## 2020-11-13 DIAGNOSIS — I2510 Atherosclerotic heart disease of native coronary artery without angina pectoris: Secondary | ICD-10-CM | POA: Diagnosis not present

## 2020-11-13 DIAGNOSIS — E46 Unspecified protein-calorie malnutrition: Secondary | ICD-10-CM | POA: Diagnosis not present

## 2020-11-13 DIAGNOSIS — N179 Acute kidney failure, unspecified: Secondary | ICD-10-CM | POA: Diagnosis not present

## 2020-11-13 DIAGNOSIS — Z7902 Long term (current) use of antithrombotics/antiplatelets: Secondary | ICD-10-CM | POA: Diagnosis not present

## 2020-11-14 DIAGNOSIS — E46 Unspecified protein-calorie malnutrition: Secondary | ICD-10-CM | POA: Diagnosis not present

## 2020-11-14 DIAGNOSIS — E119 Type 2 diabetes mellitus without complications: Secondary | ICD-10-CM | POA: Diagnosis not present

## 2020-11-14 DIAGNOSIS — Z9181 History of falling: Secondary | ICD-10-CM | POA: Diagnosis not present

## 2020-11-14 DIAGNOSIS — I252 Old myocardial infarction: Secondary | ICD-10-CM | POA: Diagnosis not present

## 2020-11-14 DIAGNOSIS — Z8673 Personal history of transient ischemic attack (TIA), and cerebral infarction without residual deficits: Secondary | ICD-10-CM | POA: Diagnosis not present

## 2020-11-14 DIAGNOSIS — E039 Hypothyroidism, unspecified: Secondary | ICD-10-CM | POA: Diagnosis not present

## 2020-11-14 DIAGNOSIS — Z87440 Personal history of urinary (tract) infections: Secondary | ICD-10-CM | POA: Diagnosis not present

## 2020-11-14 DIAGNOSIS — I10 Essential (primary) hypertension: Secondary | ICD-10-CM | POA: Diagnosis not present

## 2020-11-14 DIAGNOSIS — I2510 Atherosclerotic heart disease of native coronary artery without angina pectoris: Secondary | ICD-10-CM | POA: Diagnosis not present

## 2020-11-14 DIAGNOSIS — I480 Paroxysmal atrial fibrillation: Secondary | ICD-10-CM | POA: Diagnosis not present

## 2020-11-14 DIAGNOSIS — E785 Hyperlipidemia, unspecified: Secondary | ICD-10-CM | POA: Diagnosis not present

## 2020-11-14 DIAGNOSIS — S20419D Abrasion of unspecified back wall of thorax, subsequent encounter: Secondary | ICD-10-CM | POA: Diagnosis not present

## 2020-11-14 DIAGNOSIS — Z7984 Long term (current) use of oral hypoglycemic drugs: Secondary | ICD-10-CM | POA: Diagnosis not present

## 2020-11-14 DIAGNOSIS — E871 Hypo-osmolality and hyponatremia: Secondary | ICD-10-CM | POA: Diagnosis not present

## 2020-11-14 DIAGNOSIS — N179 Acute kidney failure, unspecified: Secondary | ICD-10-CM | POA: Diagnosis not present

## 2020-11-14 DIAGNOSIS — Z7902 Long term (current) use of antithrombotics/antiplatelets: Secondary | ICD-10-CM | POA: Diagnosis not present

## 2020-11-16 DIAGNOSIS — M25512 Pain in left shoulder: Secondary | ICD-10-CM | POA: Diagnosis not present

## 2020-11-16 DIAGNOSIS — I480 Paroxysmal atrial fibrillation: Secondary | ICD-10-CM | POA: Diagnosis not present

## 2020-11-16 DIAGNOSIS — Z681 Body mass index (BMI) 19 or less, adult: Secondary | ICD-10-CM | POA: Diagnosis not present

## 2020-11-16 DIAGNOSIS — E039 Hypothyroidism, unspecified: Secondary | ICD-10-CM | POA: Diagnosis not present

## 2020-11-16 DIAGNOSIS — E871 Hypo-osmolality and hyponatremia: Secondary | ICD-10-CM | POA: Diagnosis not present

## 2020-11-17 DIAGNOSIS — Z9181 History of falling: Secondary | ICD-10-CM | POA: Diagnosis not present

## 2020-11-17 DIAGNOSIS — Z7902 Long term (current) use of antithrombotics/antiplatelets: Secondary | ICD-10-CM | POA: Diagnosis not present

## 2020-11-17 DIAGNOSIS — I10 Essential (primary) hypertension: Secondary | ICD-10-CM | POA: Diagnosis not present

## 2020-11-17 DIAGNOSIS — E871 Hypo-osmolality and hyponatremia: Secondary | ICD-10-CM | POA: Diagnosis not present

## 2020-11-17 DIAGNOSIS — E785 Hyperlipidemia, unspecified: Secondary | ICD-10-CM | POA: Diagnosis not present

## 2020-11-17 DIAGNOSIS — E039 Hypothyroidism, unspecified: Secondary | ICD-10-CM | POA: Diagnosis not present

## 2020-11-17 DIAGNOSIS — I480 Paroxysmal atrial fibrillation: Secondary | ICD-10-CM | POA: Diagnosis not present

## 2020-11-17 DIAGNOSIS — I252 Old myocardial infarction: Secondary | ICD-10-CM | POA: Diagnosis not present

## 2020-11-17 DIAGNOSIS — Z87440 Personal history of urinary (tract) infections: Secondary | ICD-10-CM | POA: Diagnosis not present

## 2020-11-17 DIAGNOSIS — I2510 Atherosclerotic heart disease of native coronary artery without angina pectoris: Secondary | ICD-10-CM | POA: Diagnosis not present

## 2020-11-17 DIAGNOSIS — N179 Acute kidney failure, unspecified: Secondary | ICD-10-CM | POA: Diagnosis not present

## 2020-11-17 DIAGNOSIS — E46 Unspecified protein-calorie malnutrition: Secondary | ICD-10-CM | POA: Diagnosis not present

## 2020-11-17 DIAGNOSIS — Z8673 Personal history of transient ischemic attack (TIA), and cerebral infarction without residual deficits: Secondary | ICD-10-CM | POA: Diagnosis not present

## 2020-11-17 DIAGNOSIS — Z7984 Long term (current) use of oral hypoglycemic drugs: Secondary | ICD-10-CM | POA: Diagnosis not present

## 2020-11-17 DIAGNOSIS — S20419D Abrasion of unspecified back wall of thorax, subsequent encounter: Secondary | ICD-10-CM | POA: Diagnosis not present

## 2020-11-17 DIAGNOSIS — E119 Type 2 diabetes mellitus without complications: Secondary | ICD-10-CM | POA: Diagnosis not present

## 2020-11-18 DIAGNOSIS — M25512 Pain in left shoulder: Secondary | ICD-10-CM | POA: Diagnosis not present

## 2020-11-18 DIAGNOSIS — M542 Cervicalgia: Secondary | ICD-10-CM | POA: Diagnosis not present

## 2020-11-24 DIAGNOSIS — Z8673 Personal history of transient ischemic attack (TIA), and cerebral infarction without residual deficits: Secondary | ICD-10-CM | POA: Diagnosis not present

## 2020-11-24 DIAGNOSIS — Z9181 History of falling: Secondary | ICD-10-CM | POA: Diagnosis not present

## 2020-11-24 DIAGNOSIS — E871 Hypo-osmolality and hyponatremia: Secondary | ICD-10-CM | POA: Diagnosis not present

## 2020-11-24 DIAGNOSIS — Z87440 Personal history of urinary (tract) infections: Secondary | ICD-10-CM | POA: Diagnosis not present

## 2020-11-24 DIAGNOSIS — I2510 Atherosclerotic heart disease of native coronary artery without angina pectoris: Secondary | ICD-10-CM | POA: Diagnosis not present

## 2020-11-24 DIAGNOSIS — E039 Hypothyroidism, unspecified: Secondary | ICD-10-CM | POA: Diagnosis not present

## 2020-11-24 DIAGNOSIS — E46 Unspecified protein-calorie malnutrition: Secondary | ICD-10-CM | POA: Diagnosis not present

## 2020-11-24 DIAGNOSIS — Z7902 Long term (current) use of antithrombotics/antiplatelets: Secondary | ICD-10-CM | POA: Diagnosis not present

## 2020-11-24 DIAGNOSIS — S20419D Abrasion of unspecified back wall of thorax, subsequent encounter: Secondary | ICD-10-CM | POA: Diagnosis not present

## 2020-11-24 DIAGNOSIS — I252 Old myocardial infarction: Secondary | ICD-10-CM | POA: Diagnosis not present

## 2020-11-24 DIAGNOSIS — N179 Acute kidney failure, unspecified: Secondary | ICD-10-CM | POA: Diagnosis not present

## 2020-11-24 DIAGNOSIS — I480 Paroxysmal atrial fibrillation: Secondary | ICD-10-CM | POA: Diagnosis not present

## 2020-11-24 DIAGNOSIS — E785 Hyperlipidemia, unspecified: Secondary | ICD-10-CM | POA: Diagnosis not present

## 2020-11-24 DIAGNOSIS — E119 Type 2 diabetes mellitus without complications: Secondary | ICD-10-CM | POA: Diagnosis not present

## 2020-11-24 DIAGNOSIS — I10 Essential (primary) hypertension: Secondary | ICD-10-CM | POA: Diagnosis not present

## 2020-11-24 DIAGNOSIS — Z7984 Long term (current) use of oral hypoglycemic drugs: Secondary | ICD-10-CM | POA: Diagnosis not present

## 2020-11-28 DIAGNOSIS — E46 Unspecified protein-calorie malnutrition: Secondary | ICD-10-CM | POA: Diagnosis not present

## 2020-11-28 DIAGNOSIS — Z7984 Long term (current) use of oral hypoglycemic drugs: Secondary | ICD-10-CM | POA: Diagnosis not present

## 2020-11-28 DIAGNOSIS — E785 Hyperlipidemia, unspecified: Secondary | ICD-10-CM | POA: Diagnosis not present

## 2020-11-28 DIAGNOSIS — I480 Paroxysmal atrial fibrillation: Secondary | ICD-10-CM | POA: Diagnosis not present

## 2020-11-28 DIAGNOSIS — E871 Hypo-osmolality and hyponatremia: Secondary | ICD-10-CM | POA: Diagnosis not present

## 2020-11-28 DIAGNOSIS — I252 Old myocardial infarction: Secondary | ICD-10-CM | POA: Diagnosis not present

## 2020-11-28 DIAGNOSIS — N179 Acute kidney failure, unspecified: Secondary | ICD-10-CM | POA: Diagnosis not present

## 2020-11-28 DIAGNOSIS — Z87440 Personal history of urinary (tract) infections: Secondary | ICD-10-CM | POA: Diagnosis not present

## 2020-11-28 DIAGNOSIS — Z9181 History of falling: Secondary | ICD-10-CM | POA: Diagnosis not present

## 2020-11-28 DIAGNOSIS — Z7902 Long term (current) use of antithrombotics/antiplatelets: Secondary | ICD-10-CM | POA: Diagnosis not present

## 2020-11-28 DIAGNOSIS — E119 Type 2 diabetes mellitus without complications: Secondary | ICD-10-CM | POA: Diagnosis not present

## 2020-11-28 DIAGNOSIS — Z8673 Personal history of transient ischemic attack (TIA), and cerebral infarction without residual deficits: Secondary | ICD-10-CM | POA: Diagnosis not present

## 2020-11-28 DIAGNOSIS — S20419D Abrasion of unspecified back wall of thorax, subsequent encounter: Secondary | ICD-10-CM | POA: Diagnosis not present

## 2020-11-28 DIAGNOSIS — I10 Essential (primary) hypertension: Secondary | ICD-10-CM | POA: Diagnosis not present

## 2020-11-28 DIAGNOSIS — E039 Hypothyroidism, unspecified: Secondary | ICD-10-CM | POA: Diagnosis not present

## 2020-11-28 DIAGNOSIS — I2510 Atherosclerotic heart disease of native coronary artery without angina pectoris: Secondary | ICD-10-CM | POA: Diagnosis not present

## 2020-11-30 DIAGNOSIS — Z7902 Long term (current) use of antithrombotics/antiplatelets: Secondary | ICD-10-CM | POA: Diagnosis not present

## 2020-11-30 DIAGNOSIS — E46 Unspecified protein-calorie malnutrition: Secondary | ICD-10-CM | POA: Diagnosis not present

## 2020-11-30 DIAGNOSIS — I10 Essential (primary) hypertension: Secondary | ICD-10-CM | POA: Diagnosis not present

## 2020-11-30 DIAGNOSIS — Z7984 Long term (current) use of oral hypoglycemic drugs: Secondary | ICD-10-CM | POA: Diagnosis not present

## 2020-11-30 DIAGNOSIS — Z8673 Personal history of transient ischemic attack (TIA), and cerebral infarction without residual deficits: Secondary | ICD-10-CM | POA: Diagnosis not present

## 2020-11-30 DIAGNOSIS — E119 Type 2 diabetes mellitus without complications: Secondary | ICD-10-CM | POA: Diagnosis not present

## 2020-11-30 DIAGNOSIS — S20419D Abrasion of unspecified back wall of thorax, subsequent encounter: Secondary | ICD-10-CM | POA: Diagnosis not present

## 2020-11-30 DIAGNOSIS — I2510 Atherosclerotic heart disease of native coronary artery without angina pectoris: Secondary | ICD-10-CM | POA: Diagnosis not present

## 2020-11-30 DIAGNOSIS — E039 Hypothyroidism, unspecified: Secondary | ICD-10-CM | POA: Diagnosis not present

## 2020-11-30 DIAGNOSIS — E785 Hyperlipidemia, unspecified: Secondary | ICD-10-CM | POA: Diagnosis not present

## 2020-11-30 DIAGNOSIS — Z87440 Personal history of urinary (tract) infections: Secondary | ICD-10-CM | POA: Diagnosis not present

## 2020-11-30 DIAGNOSIS — I480 Paroxysmal atrial fibrillation: Secondary | ICD-10-CM | POA: Diagnosis not present

## 2020-11-30 DIAGNOSIS — Z9181 History of falling: Secondary | ICD-10-CM | POA: Diagnosis not present

## 2020-11-30 DIAGNOSIS — E871 Hypo-osmolality and hyponatremia: Secondary | ICD-10-CM | POA: Diagnosis not present

## 2020-11-30 DIAGNOSIS — N179 Acute kidney failure, unspecified: Secondary | ICD-10-CM | POA: Diagnosis not present

## 2020-11-30 DIAGNOSIS — I252 Old myocardial infarction: Secondary | ICD-10-CM | POA: Diagnosis not present

## 2020-12-02 DIAGNOSIS — I252 Old myocardial infarction: Secondary | ICD-10-CM | POA: Diagnosis not present

## 2020-12-02 DIAGNOSIS — I2510 Atherosclerotic heart disease of native coronary artery without angina pectoris: Secondary | ICD-10-CM | POA: Diagnosis not present

## 2020-12-02 DIAGNOSIS — N179 Acute kidney failure, unspecified: Secondary | ICD-10-CM | POA: Diagnosis not present

## 2020-12-02 DIAGNOSIS — E119 Type 2 diabetes mellitus without complications: Secondary | ICD-10-CM | POA: Diagnosis not present

## 2020-12-02 DIAGNOSIS — Z9181 History of falling: Secondary | ICD-10-CM | POA: Diagnosis not present

## 2020-12-02 DIAGNOSIS — Z7902 Long term (current) use of antithrombotics/antiplatelets: Secondary | ICD-10-CM | POA: Diagnosis not present

## 2020-12-02 DIAGNOSIS — Z7984 Long term (current) use of oral hypoglycemic drugs: Secondary | ICD-10-CM | POA: Diagnosis not present

## 2020-12-02 DIAGNOSIS — S20419D Abrasion of unspecified back wall of thorax, subsequent encounter: Secondary | ICD-10-CM | POA: Diagnosis not present

## 2020-12-02 DIAGNOSIS — E039 Hypothyroidism, unspecified: Secondary | ICD-10-CM | POA: Diagnosis not present

## 2020-12-02 DIAGNOSIS — I480 Paroxysmal atrial fibrillation: Secondary | ICD-10-CM | POA: Diagnosis not present

## 2020-12-02 DIAGNOSIS — Z8673 Personal history of transient ischemic attack (TIA), and cerebral infarction without residual deficits: Secondary | ICD-10-CM | POA: Diagnosis not present

## 2020-12-02 DIAGNOSIS — M25512 Pain in left shoulder: Secondary | ICD-10-CM | POA: Diagnosis not present

## 2020-12-02 DIAGNOSIS — I10 Essential (primary) hypertension: Secondary | ICD-10-CM | POA: Diagnosis not present

## 2020-12-02 DIAGNOSIS — E785 Hyperlipidemia, unspecified: Secondary | ICD-10-CM | POA: Diagnosis not present

## 2020-12-02 DIAGNOSIS — Z87440 Personal history of urinary (tract) infections: Secondary | ICD-10-CM | POA: Diagnosis not present

## 2020-12-02 DIAGNOSIS — E871 Hypo-osmolality and hyponatremia: Secondary | ICD-10-CM | POA: Diagnosis not present

## 2020-12-02 DIAGNOSIS — E46 Unspecified protein-calorie malnutrition: Secondary | ICD-10-CM | POA: Diagnosis not present

## 2020-12-02 DIAGNOSIS — M542 Cervicalgia: Secondary | ICD-10-CM | POA: Diagnosis not present

## 2020-12-08 DIAGNOSIS — S20419D Abrasion of unspecified back wall of thorax, subsequent encounter: Secondary | ICD-10-CM | POA: Diagnosis not present

## 2020-12-08 DIAGNOSIS — Z87440 Personal history of urinary (tract) infections: Secondary | ICD-10-CM | POA: Diagnosis not present

## 2020-12-08 DIAGNOSIS — N179 Acute kidney failure, unspecified: Secondary | ICD-10-CM | POA: Diagnosis not present

## 2020-12-08 DIAGNOSIS — Z9181 History of falling: Secondary | ICD-10-CM | POA: Diagnosis not present

## 2020-12-08 DIAGNOSIS — E785 Hyperlipidemia, unspecified: Secondary | ICD-10-CM | POA: Diagnosis not present

## 2020-12-08 DIAGNOSIS — Z8673 Personal history of transient ischemic attack (TIA), and cerebral infarction without residual deficits: Secondary | ICD-10-CM | POA: Diagnosis not present

## 2020-12-08 DIAGNOSIS — I2510 Atherosclerotic heart disease of native coronary artery without angina pectoris: Secondary | ICD-10-CM | POA: Diagnosis not present

## 2020-12-08 DIAGNOSIS — I10 Essential (primary) hypertension: Secondary | ICD-10-CM | POA: Diagnosis not present

## 2020-12-08 DIAGNOSIS — I480 Paroxysmal atrial fibrillation: Secondary | ICD-10-CM | POA: Diagnosis not present

## 2020-12-08 DIAGNOSIS — Z7902 Long term (current) use of antithrombotics/antiplatelets: Secondary | ICD-10-CM | POA: Diagnosis not present

## 2020-12-08 DIAGNOSIS — I252 Old myocardial infarction: Secondary | ICD-10-CM | POA: Diagnosis not present

## 2020-12-08 DIAGNOSIS — E039 Hypothyroidism, unspecified: Secondary | ICD-10-CM | POA: Diagnosis not present

## 2020-12-08 DIAGNOSIS — E46 Unspecified protein-calorie malnutrition: Secondary | ICD-10-CM | POA: Diagnosis not present

## 2020-12-08 DIAGNOSIS — E871 Hypo-osmolality and hyponatremia: Secondary | ICD-10-CM | POA: Diagnosis not present

## 2020-12-08 DIAGNOSIS — Z7984 Long term (current) use of oral hypoglycemic drugs: Secondary | ICD-10-CM | POA: Diagnosis not present

## 2020-12-08 DIAGNOSIS — E119 Type 2 diabetes mellitus without complications: Secondary | ICD-10-CM | POA: Diagnosis not present

## 2020-12-14 DIAGNOSIS — Z8673 Personal history of transient ischemic attack (TIA), and cerebral infarction without residual deficits: Secondary | ICD-10-CM | POA: Diagnosis not present

## 2020-12-14 DIAGNOSIS — Z7902 Long term (current) use of antithrombotics/antiplatelets: Secondary | ICD-10-CM | POA: Diagnosis not present

## 2020-12-14 DIAGNOSIS — Z9181 History of falling: Secondary | ICD-10-CM | POA: Diagnosis not present

## 2020-12-14 DIAGNOSIS — I2510 Atherosclerotic heart disease of native coronary artery without angina pectoris: Secondary | ICD-10-CM | POA: Diagnosis not present

## 2020-12-14 DIAGNOSIS — I480 Paroxysmal atrial fibrillation: Secondary | ICD-10-CM | POA: Diagnosis not present

## 2020-12-14 DIAGNOSIS — E46 Unspecified protein-calorie malnutrition: Secondary | ICD-10-CM | POA: Diagnosis not present

## 2020-12-14 DIAGNOSIS — E785 Hyperlipidemia, unspecified: Secondary | ICD-10-CM | POA: Diagnosis not present

## 2020-12-14 DIAGNOSIS — E119 Type 2 diabetes mellitus without complications: Secondary | ICD-10-CM | POA: Diagnosis not present

## 2020-12-14 DIAGNOSIS — I10 Essential (primary) hypertension: Secondary | ICD-10-CM | POA: Diagnosis not present

## 2020-12-14 DIAGNOSIS — S20419D Abrasion of unspecified back wall of thorax, subsequent encounter: Secondary | ICD-10-CM | POA: Diagnosis not present

## 2020-12-14 DIAGNOSIS — N179 Acute kidney failure, unspecified: Secondary | ICD-10-CM | POA: Diagnosis not present

## 2020-12-14 DIAGNOSIS — Z87440 Personal history of urinary (tract) infections: Secondary | ICD-10-CM | POA: Diagnosis not present

## 2020-12-14 DIAGNOSIS — E871 Hypo-osmolality and hyponatremia: Secondary | ICD-10-CM | POA: Diagnosis not present

## 2020-12-14 DIAGNOSIS — I252 Old myocardial infarction: Secondary | ICD-10-CM | POA: Diagnosis not present

## 2020-12-14 DIAGNOSIS — E039 Hypothyroidism, unspecified: Secondary | ICD-10-CM | POA: Diagnosis not present

## 2020-12-14 DIAGNOSIS — Z7984 Long term (current) use of oral hypoglycemic drugs: Secondary | ICD-10-CM | POA: Diagnosis not present

## 2021-01-03 DIAGNOSIS — M810 Age-related osteoporosis without current pathological fracture: Secondary | ICD-10-CM | POA: Diagnosis not present

## 2021-01-03 DIAGNOSIS — E039 Hypothyroidism, unspecified: Secondary | ICD-10-CM | POA: Diagnosis not present

## 2021-01-03 DIAGNOSIS — I480 Paroxysmal atrial fibrillation: Secondary | ICD-10-CM | POA: Diagnosis not present

## 2021-01-03 DIAGNOSIS — E785 Hyperlipidemia, unspecified: Secondary | ICD-10-CM | POA: Diagnosis not present

## 2021-01-03 DIAGNOSIS — E46 Unspecified protein-calorie malnutrition: Secondary | ICD-10-CM | POA: Diagnosis not present

## 2021-01-03 DIAGNOSIS — D519 Vitamin B12 deficiency anemia, unspecified: Secondary | ICD-10-CM | POA: Diagnosis not present

## 2021-01-03 DIAGNOSIS — E1165 Type 2 diabetes mellitus with hyperglycemia: Secondary | ICD-10-CM | POA: Diagnosis not present

## 2021-01-03 DIAGNOSIS — I10 Essential (primary) hypertension: Secondary | ICD-10-CM | POA: Diagnosis not present

## 2021-01-03 DIAGNOSIS — E1129 Type 2 diabetes mellitus with other diabetic kidney complication: Secondary | ICD-10-CM | POA: Diagnosis not present

## 2021-01-03 DIAGNOSIS — D509 Iron deficiency anemia, unspecified: Secondary | ICD-10-CM | POA: Diagnosis not present

## 2021-02-07 DIAGNOSIS — D519 Vitamin B12 deficiency anemia, unspecified: Secondary | ICD-10-CM | POA: Diagnosis not present

## 2021-02-17 ENCOUNTER — Other Ambulatory Visit: Payer: Self-pay | Admitting: *Deleted

## 2021-02-17 MED ORDER — LOSARTAN POTASSIUM 25 MG PO TABS: 25.0000 mg | ORAL_TABLET | Freq: Every day | ORAL | 1 refills | Status: DC

## 2021-02-20 ENCOUNTER — Encounter: Payer: Self-pay | Admitting: Gastroenterology

## 2021-03-09 DIAGNOSIS — D519 Vitamin B12 deficiency anemia, unspecified: Secondary | ICD-10-CM | POA: Diagnosis not present

## 2021-04-05 DIAGNOSIS — E1129 Type 2 diabetes mellitus with other diabetic kidney complication: Secondary | ICD-10-CM | POA: Diagnosis not present

## 2021-04-05 DIAGNOSIS — E039 Hypothyroidism, unspecified: Secondary | ICD-10-CM | POA: Diagnosis not present

## 2021-04-05 DIAGNOSIS — I480 Paroxysmal atrial fibrillation: Secondary | ICD-10-CM | POA: Diagnosis not present

## 2021-04-05 DIAGNOSIS — D509 Iron deficiency anemia, unspecified: Secondary | ICD-10-CM | POA: Diagnosis not present

## 2021-04-05 DIAGNOSIS — E785 Hyperlipidemia, unspecified: Secondary | ICD-10-CM | POA: Diagnosis not present

## 2021-04-05 DIAGNOSIS — I10 Essential (primary) hypertension: Secondary | ICD-10-CM | POA: Diagnosis not present

## 2021-04-05 DIAGNOSIS — M810 Age-related osteoporosis without current pathological fracture: Secondary | ICD-10-CM | POA: Diagnosis not present

## 2021-04-05 DIAGNOSIS — Z23 Encounter for immunization: Secondary | ICD-10-CM | POA: Diagnosis not present

## 2021-04-05 DIAGNOSIS — D519 Vitamin B12 deficiency anemia, unspecified: Secondary | ICD-10-CM | POA: Diagnosis not present

## 2021-04-10 DIAGNOSIS — D519 Vitamin B12 deficiency anemia, unspecified: Secondary | ICD-10-CM | POA: Diagnosis not present

## 2021-05-02 DIAGNOSIS — J069 Acute upper respiratory infection, unspecified: Secondary | ICD-10-CM | POA: Diagnosis not present

## 2021-05-09 NOTE — Progress Notes (Signed)
Patient ID: Melissa Jackson, female   DOB: 05/14/1941, 80 y.o.   MRN: [id number]     Cardiology Office Note   Date:  05/12/2021   ID:  Melissa Jackson, DOB 1940/09/19, MRN [id number]  PCP:  [name], MD  Cardiologist:   Jenkins Rouge, MD   No chief complaint on file.     History of Present Illness:  80 y.o. f/u CAD. Lives in [place] and sees [name] as primary. Previous patient of [name]. September 2014 presented with angina DES to LAD and Ramus with POB of D1 EF 55% PAF with SSS. Maintaining NSR on amiodarone Beta blocker dose has been decreased due to low HR's. Intolerant to statin with muscle wasting and pain Cough with ACE but tolerates ARB for BP control.   Admitted to hospital 03/18/19 with weakness and confusion UTI started on cipro 03/16/19 BS low 51 Rx dextrose Synthroid dose reduced for suppressed TSH  She seems to have more advanced dementia  Husband stopped her ASA due to nightly stomach upset on plavix instead   Given age  only using red yeast rice for cholesterol LDL 134 Cr 1.48 on labs with primary On 10/03/20   Past Medical History:  Diagnosis Date   Acute myocardial infarction, subendocardial infarction, initial episode of care ([place])    Anemia    Anticoagulated    ON PRADAXA   Atrial fibrillation ([place])    Coronary artery disease    Diabetes mellitus with no complication (HCC)    Disease of tricuspid valve    MILD   Fall    First degree atrioventricular block    Head ache    High risk medication use    History of total hysterectomy    Hyperlipidemia    SEVERE   Hypertension, essential    Hypothyroidism    Left atrial enlargement    MILD   Mitral valvular disorder    MILD-MODERATE   Old myocardial infarction    JAN 2008   Takotsubo syndrome     Past Surgical History:  Procedure Laterality Date   APPENDECTOMY     COLONOSCOPY  03/28/2005   Moderate left colonic diverticulosis. Internal hemorrhoids.   INCONTINENCE SURGERY     NO PAST  SURGERIES     TOTAL ABDOMINAL HYSTERECTOMY       Current Outpatient Medications  Medication Sig Dispense Refill   alendronate (FOSAMAX) 70 MG tablet Take 70 mg by mouth once a week. Takes on fridays     amiodarone (PACERONE) 200 MG tablet Take 200 mg by mouth daily.     amLODipine (NORVASC) 5 MG tablet Take 5 mg by mouth daily.     chlorthalidone (HYGROTON) 25 MG tablet Take 25 mg by mouth every morning.     clopidogrel (PLAVIX) 75 MG tablet Take 75 mg by mouth daily.     ferrous sulfate 325 (65 FE) MG tablet Take 325 mg by mouth 3 (three) times daily with meals.     levothyroxine (SYNTHROID) 100 MCG tablet Take 100 mcg by mouth daily.     levothyroxine (SYNTHROID) 112 MCG tablet Take 1 tablet (112 mcg total) by mouth daily before breakfast. 30 tablet 0   losartan (COZAAR) 25 MG tablet Take 1 tablet (25 mg total) by mouth daily. 90 tablet 1   metoprolol tartrate (LOPRESSOR) 25 MG tablet Take 0.5 tablets (12.5 mg total) by mouth 2 (two) times daily. 30 tablet 0   Multiple Vitamins-Minerals (MULTIVITAMIN WITH MINERALS) tablet Take 1 tablet  by mouth daily.     nitroGLYCERIN (NITROSTAT) 0.4 MG SL tablet Place 0.4 mg under the tongue every 5 (five) minutes as needed for chest pain.     Omega 3 1000 MG CAPS Take 2 capsules by mouth 2 (two) times daily.     pravastatin (PRAVACHOL) 20 MG tablet as directed.  5   Red Yeast Rice 600 MG CAPS Take 1 capsule by mouth daily.      metFORMIN (GLUCOPHAGE) 1000 MG tablet Take 1,000 mg by mouth 2 (two) times daily. (Patient not taking: Reported on 05/12/2021)     No current facility-administered medications for this visit.    Allergies:   Lisinopril, Onion, Codeine, Morphine and related, and Statins    Social History:  The patient  reports that she has never smoked. She has never used smokeless tobacco. She reports that she does not drink alcohol and does not use drugs.   Family History:  The patient's family history includes Alzheimer's disease in her  mother; Diabetes Mellitus I in her mother; Heart attack in her father.    ROS:  Please see the history of present illness.   Otherwise, review of systems are positive for none.   All other systems are reviewed and negative.    PHYSICAL EXAM: VS:  BP (!) 168/72   Pulse 60   Ht 5\' 4"  (1.626 m)   Wt 111 lb 3.2 oz (50.4 kg)   LMP  (LMP Unknown)   SpO2 91%   BMI 19.09 kg/m  , BMI Body mass index is 19.09 kg/m. Affect appropriate Healthy:  appears stated age 80: normal Neck supple with no adenopathy JVP normal no bruits no thyromegaly Lungs clear with no wheezing and good diaphragmatic motion Heart:  S1/S2 no murmur, no rub, gallop or click PMI normal Abdomen: benighn, BS positve, no tenderness, no AAA no bruit.  No HSM or HJR Distal pulses intact with no bruits No edema Neuro non-focal Skin warm and dry No muscular weakness   EKG: 10/10/17 SR rate 58 nonspecific ST changes 05/12/2021 SR rate 54 inferior lateral T wave changes    Recent Labs: No results found for requested labs within last 8760 hours.    Lipid Panel No results found for: CHOL, TRIG, HDL, CHOLHDL, VLDL, LDLCALC, LDLDIRECT    Wt Readings from Last 3 Encounters:  05/12/21 111 lb 3.2 oz (50.4 kg)  09/09/20 99 lb 6.4 oz (45.1 kg)  12/11/19 106 lb (48.1 kg)      Other studies Reviewed: Additional studies/ records that were reviewed today include: Records from [place], [place], [place] and [place] .Cath 2014 and echo 2014     ASSESSMENT AND PLAN:  1. CAD:  Given complexity of intervention 2014 continue DAT.  No angina medical Rx 2. PAF:  Continue low dose amiodarone. LFT;s/TSH normal 07/2020  PFTls/ DLCO normal 12/16/19  3. SSS:  Lower dose lopressor to 25 bid and f/u no syncopal symptoms or HB 4. Chol:  Discussed taking 1200 mg red yeast rice daly   5. HTN:  With DM and significant CAD on ARB tolerating well Significant white coat component She assures me home readings are fine always high in our office   6. Thyroid TSH normal 07/2020   7. Confusion: related to low BS and UTI resolved   F/U in 6 months   Signed, Jenkins Rouge, MD  05/12/2021 8:08 AM    [facility] [address], [place], [place]  [address] Phone: [phone];  Fax: [phone]  

## 2021-05-10 DIAGNOSIS — D519 Vitamin B12 deficiency anemia, unspecified: Secondary | ICD-10-CM | POA: Diagnosis not present

## 2021-05-12 ENCOUNTER — Other Ambulatory Visit: Payer: Self-pay

## 2021-05-12 ENCOUNTER — Ambulatory Visit: Payer: Medicare Other | Admitting: Cardiovascular Disease

## 2021-05-12 ENCOUNTER — Encounter: Payer: Self-pay | Admitting: Cardiovascular Disease

## 2021-05-12 VITALS — BP 168/72 | HR 60 | Ht 64.0 in | Wt 111.2 lb

## 2021-05-12 DIAGNOSIS — Z79899 Other long term (current) drug therapy: Secondary | ICD-10-CM

## 2021-05-12 DIAGNOSIS — I2510 Atherosclerotic heart disease of native coronary artery without angina pectoris: Secondary | ICD-10-CM

## 2021-05-12 DIAGNOSIS — I480 Paroxysmal atrial fibrillation: Secondary | ICD-10-CM | POA: Diagnosis not present

## 2021-05-12 NOTE — Patient Instructions (Signed)
Medication Instructions:  Your physician recommends that you continue on your current medications as directed. Please refer to the Current Medication list given to you today.  *If you need a refill on your cardiac medications before your next appointment, please call your pharmacy*  Follow-Up: At [facility], you and your health needs are our priority.  As part of our continuing mission to provide you with exceptional heart care, we have created designated Provider Care Teams.  These Care Teams include your primary Cardiologist (physician) and Advanced Practice Providers (APPs -  Physician Assistants and Nurse Practitioners) who all work together to provide you with the care you need, when you need it.  Your next appointment:   6 month(s)  The format for your next appointment:   In Person  Provider:   Peter Nishan, MD     

## 2021-06-02 DIAGNOSIS — E1129 Type 2 diabetes mellitus with other diabetic kidney complication: Secondary | ICD-10-CM | POA: Diagnosis not present

## 2021-06-02 DIAGNOSIS — I10 Essential (primary) hypertension: Secondary | ICD-10-CM | POA: Diagnosis not present

## 2021-06-12 DIAGNOSIS — D519 Vitamin B12 deficiency anemia, unspecified: Secondary | ICD-10-CM | POA: Diagnosis not present

## 2021-07-07 DIAGNOSIS — E785 Hyperlipidemia, unspecified: Secondary | ICD-10-CM | POA: Diagnosis not present

## 2021-07-07 DIAGNOSIS — I480 Paroxysmal atrial fibrillation: Secondary | ICD-10-CM | POA: Diagnosis not present

## 2021-07-07 DIAGNOSIS — D519 Vitamin B12 deficiency anemia, unspecified: Secondary | ICD-10-CM | POA: Diagnosis not present

## 2021-07-07 DIAGNOSIS — E1129 Type 2 diabetes mellitus with other diabetic kidney complication: Secondary | ICD-10-CM | POA: Diagnosis not present

## 2021-07-07 DIAGNOSIS — D509 Iron deficiency anemia, unspecified: Secondary | ICD-10-CM | POA: Diagnosis not present

## 2021-07-07 DIAGNOSIS — E039 Hypothyroidism, unspecified: Secondary | ICD-10-CM | POA: Diagnosis not present

## 2021-07-07 DIAGNOSIS — M810 Age-related osteoporosis without current pathological fracture: Secondary | ICD-10-CM | POA: Diagnosis not present

## 2021-07-07 DIAGNOSIS — I10 Essential (primary) hypertension: Secondary | ICD-10-CM | POA: Diagnosis not present

## 2021-07-13 DIAGNOSIS — D519 Vitamin B12 deficiency anemia, unspecified: Secondary | ICD-10-CM | POA: Diagnosis not present

## 2021-07-17 DIAGNOSIS — E1129 Type 2 diabetes mellitus with other diabetic kidney complication: Secondary | ICD-10-CM | POA: Diagnosis not present

## 2021-08-14 DIAGNOSIS — D519 Vitamin B12 deficiency anemia, unspecified: Secondary | ICD-10-CM | POA: Diagnosis not present

## 2021-08-14 DIAGNOSIS — F03918 Unspecified dementia, unspecified severity, with other behavioral disturbance: Secondary | ICD-10-CM | POA: Diagnosis not present

## 2021-08-14 DIAGNOSIS — E1129 Type 2 diabetes mellitus with other diabetic kidney complication: Secondary | ICD-10-CM | POA: Diagnosis not present

## 2021-09-19 DIAGNOSIS — E785 Hyperlipidemia, unspecified: Secondary | ICD-10-CM | POA: Diagnosis not present

## 2021-09-19 DIAGNOSIS — F03918 Unspecified dementia, unspecified severity, with other behavioral disturbance: Secondary | ICD-10-CM | POA: Diagnosis not present

## 2021-09-19 DIAGNOSIS — D509 Iron deficiency anemia, unspecified: Secondary | ICD-10-CM | POA: Diagnosis not present

## 2021-09-19 DIAGNOSIS — E1129 Type 2 diabetes mellitus with other diabetic kidney complication: Secondary | ICD-10-CM | POA: Diagnosis not present

## 2021-09-19 DIAGNOSIS — I10 Essential (primary) hypertension: Secondary | ICD-10-CM | POA: Diagnosis not present

## 2021-09-19 DIAGNOSIS — M810 Age-related osteoporosis without current pathological fracture: Secondary | ICD-10-CM | POA: Diagnosis not present

## 2021-09-19 DIAGNOSIS — D519 Vitamin B12 deficiency anemia, unspecified: Secondary | ICD-10-CM | POA: Diagnosis not present

## 2021-09-19 DIAGNOSIS — E039 Hypothyroidism, unspecified: Secondary | ICD-10-CM | POA: Diagnosis not present

## 2021-09-19 DIAGNOSIS — I480 Paroxysmal atrial fibrillation: Secondary | ICD-10-CM | POA: Diagnosis not present

## 2021-10-05 NOTE — Progress Notes (Signed)
Patient ID: Melissa Jackson, female   DOB: 10-Nov-1940, 81 y.o.   MRN: [id number]     Cardiology Office Note   Date:  10/19/2021   ID:  Melissa Jackson, DOB 03/02/1941, MRN [id number]  PCP:  [name], MD  Cardiologist:   Jenkins Rouge, MD   No chief complaint on file.      History of Present Illness:  81 y.o. f/u CAD. Lives in [place] and sees [name] as primary. Previous patient of [name]. September 2014 presented with angina DES to LAD and Ramus with POB of D1 EF 55% PAF with SSS. Maintaining NSR on amiodarone Beta blocker dose has been decreased due to low HR's. Intolerant to statin with muscle wasting and pain Cough with ACE but tolerates ARB for BP control.   Admitted to hospital 03/18/19 with weakness and confusion UTI started on cipro 03/16/19 BS low 51 Rx dextrose Synthroid dose reduced for suppressed TSH  She seems to have more advanced dementia  Husband stopped her ASA due to nightly stomach upset on plavix instead   Given age  only using red yeast rice for cholesterol LDL 134 Cr 1.48 on labs with primary On 10/03/20   Past Medical History:  Diagnosis Date   Acute myocardial infarction, subendocardial infarction, initial episode of care ([place])    Anemia    Anticoagulated    ON PRADAXA   Atrial fibrillation ([place])    Coronary artery disease    Diabetes mellitus with no complication (HCC)    Disease of tricuspid valve    MILD   Fall    First degree atrioventricular block    Head ache    High risk medication use    History of total hysterectomy    Hyperlipidemia    SEVERE   Hypertension, essential    Hypothyroidism    Left atrial enlargement    MILD   Mitral valvular disorder    MILD-MODERATE   Old myocardial infarction    JAN 2008   Takotsubo syndrome     Past Surgical History:  Procedure Laterality Date   APPENDECTOMY     COLONOSCOPY  03/28/2005   Moderate left colonic diverticulosis. Internal hemorrhoids.   INCONTINENCE SURGERY     NO PAST  SURGERIES     TOTAL ABDOMINAL HYSTERECTOMY       Current Outpatient Medications  Medication Sig Dispense Refill   alendronate (FOSAMAX) 70 MG tablet Take 70 mg by mouth once a week. Takes on fridays     amiodarone (PACERONE) 200 MG tablet Take 200 mg by mouth daily.     amLODipine (NORVASC) 5 MG tablet Take 5 mg by mouth daily.     chlorthalidone (HYGROTON) 25 MG tablet Take 25 mg by mouth every morning.     clopidogrel (PLAVIX) 75 MG tablet Take 75 mg by mouth daily.     ferrous sulfate 325 (65 FE) MG tablet Take 325 mg by mouth 3 (three) times daily with meals.     levothyroxine (SYNTHROID) 100 MCG tablet Take 100 mcg by mouth daily.     levothyroxine (SYNTHROID) 112 MCG tablet Take 1 tablet (112 mcg total) by mouth daily before breakfast. 30 tablet 0   losartan (COZAAR) 25 MG tablet Take 1 tablet (25 mg total) by mouth daily. 90 tablet 1   metoprolol tartrate (LOPRESSOR) 25 MG tablet Take 0.5 tablets (12.5 mg total) by mouth 2 (two) times daily. 30 tablet 0   Multiple Vitamins-Minerals (MULTIVITAMIN WITH MINERALS) tablet Take 1  tablet by mouth daily.     nitroGLYCERIN (NITROSTAT) 0.4 MG SL tablet Place 0.4 mg under the tongue every 5 (five) minutes as needed for chest pain.     Omega 3 1000 MG CAPS Take 2 capsules by mouth 2 (two) times daily.     pravastatin (PRAVACHOL) 20 MG tablet as directed.  5   Red Yeast Rice 600 MG CAPS Take 1 capsule by mouth daily.      metFORMIN (GLUCOPHAGE) 1000 MG tablet Take 1,000 mg by mouth 2 (two) times daily. (Patient not taking: Reported on 10/19/2021)     No current facility-administered medications for this visit.    Allergies:   Lisinopril, Onion, Codeine, Morphine and related, and Statins    Social History:  The patient  reports that she has never smoked. She has never used smokeless tobacco. She reports that she does not drink alcohol and does not use drugs.   Family History:  The patient's family history includes Alzheimer's disease in her  mother; Diabetes Mellitus I in her mother; Heart attack in her father.    ROS:  Please see the history of present illness.   Otherwise, review of systems are positive for none.   All other systems are reviewed and negative.    PHYSICAL EXAM: VS:  BP 130/62   Pulse (!) 55   Ht '5\' 4"'$  (1.626 m)   Wt 119 lb (54 kg)   LMP  (LMP Unknown)   SpO2 93%   BMI 20.43 kg/m  , BMI Body mass index is 20.43 kg/m. Affect appropriate Healthy:  appears stated age 52: normal Neck supple with no adenopathy JVP normal no bruits no thyromegaly Lungs clear with no wheezing and good diaphragmatic motion Heart:  S1/S2 no murmur, no rub, gallop or click PMI normal Abdomen: benighn, BS positve, no tenderness, no AAA no bruit.  No HSM or HJR Distal pulses intact with no bruits No edema Neuro non-focal Skin warm and dry No muscular weakness   EKG: 10/10/17 SR rate 58 nonspecific ST changes 10/19/2021 SR rate 54 inferior lateral T wave changes  10/19/2021 SR rate 55 nonspecific ST changes   Recent Labs: No results found for requested labs within last 8760 hours.    Lipid Panel No results found for: CHOL, TRIG, HDL, CHOLHDL, VLDL, LDLCALC, LDLDIRECT    Wt Readings from Last 3 Encounters:  10/19/21 119 lb (54 kg)  05/12/21 111 lb 3.2 oz (50.4 kg)  09/09/20 99 lb 6.4 oz (45.1 kg)      Other studies Reviewed: Additional studies/ records that were reviewed today include: Records from [place], [place], [place] and [place] .Cath 2014 and echo 2014     ASSESSMENT AND PLAN:  1. CAD:  Given complexity of intervention 2014 continue Plavix stomach upset with ASA .  No angina medical Rx 2. PAF:  Continue low dose amiodarone. LFT;s/TSH normal 07/2020  PFTls/ DLCO normal 12/16/19    She is elderly with dementia and has been in NSR for year so no anticoagulation  3. SSS:  Lower dose lopressor to 25 bid and f/u no syncopal symptoms or HB 4. Chol:  Discussed taking 1200 mg red yeast rice daly  intolerant statin   5. HTN:  With DM and significant CAD on ARB tolerating well Significant white coat component She assures me home readings are fine always high in our office  6. Thyroid TSH normal 07/2020   7. Confusion: related to low BS and UTI resolved    F/U  in 6 months   Signed, Jenkins Rouge, MD  10/19/2021 9:54 AM    [facility] [address], [place], [place]  [address] Phone: [phone]; Fax: [phone]

## 2021-10-14 DIAGNOSIS — E1129 Type 2 diabetes mellitus with other diabetic kidney complication: Secondary | ICD-10-CM | POA: Diagnosis not present

## 2021-10-19 ENCOUNTER — Encounter: Payer: Self-pay | Admitting: Cardiovascular Disease

## 2021-10-19 ENCOUNTER — Ambulatory Visit: Payer: Medicare Other | Admitting: Cardiovascular Disease

## 2021-10-19 VITALS — BP 130/62 | HR 55 | Ht 64.0 in | Wt 119.0 lb

## 2021-10-19 DIAGNOSIS — I480 Paroxysmal atrial fibrillation: Secondary | ICD-10-CM

## 2021-10-19 DIAGNOSIS — I2510 Atherosclerotic heart disease of native coronary artery without angina pectoris: Secondary | ICD-10-CM

## 2021-10-19 DIAGNOSIS — Z79899 Other long term (current) drug therapy: Secondary | ICD-10-CM

## 2021-10-19 NOTE — Patient Instructions (Addendum)

## 2021-10-20 DIAGNOSIS — I10 Essential (primary) hypertension: Secondary | ICD-10-CM | POA: Diagnosis not present

## 2021-10-20 DIAGNOSIS — E1129 Type 2 diabetes mellitus with other diabetic kidney complication: Secondary | ICD-10-CM | POA: Diagnosis not present

## 2021-10-20 DIAGNOSIS — I480 Paroxysmal atrial fibrillation: Secondary | ICD-10-CM | POA: Diagnosis not present

## 2021-10-20 DIAGNOSIS — D509 Iron deficiency anemia, unspecified: Secondary | ICD-10-CM | POA: Diagnosis not present

## 2021-10-20 DIAGNOSIS — M810 Age-related osteoporosis without current pathological fracture: Secondary | ICD-10-CM | POA: Diagnosis not present

## 2021-10-20 DIAGNOSIS — E039 Hypothyroidism, unspecified: Secondary | ICD-10-CM | POA: Diagnosis not present

## 2021-10-20 DIAGNOSIS — E785 Hyperlipidemia, unspecified: Secondary | ICD-10-CM | POA: Diagnosis not present

## 2021-10-20 DIAGNOSIS — F03918 Unspecified dementia, unspecified severity, with other behavioral disturbance: Secondary | ICD-10-CM | POA: Diagnosis not present

## 2021-10-20 DIAGNOSIS — D519 Vitamin B12 deficiency anemia, unspecified: Secondary | ICD-10-CM | POA: Diagnosis not present

## 2021-11-21 DIAGNOSIS — D519 Vitamin B12 deficiency anemia, unspecified: Secondary | ICD-10-CM | POA: Diagnosis not present

## 2021-11-27 DIAGNOSIS — M1712 Unilateral primary osteoarthritis, left knee: Secondary | ICD-10-CM | POA: Diagnosis not present

## 2021-11-27 DIAGNOSIS — M25511 Pain in right shoulder: Secondary | ICD-10-CM | POA: Diagnosis not present

## 2021-11-27 DIAGNOSIS — M1711 Unilateral primary osteoarthritis, right knee: Secondary | ICD-10-CM | POA: Diagnosis not present

## 2021-11-27 DIAGNOSIS — M170 Bilateral primary osteoarthritis of knee: Secondary | ICD-10-CM | POA: Diagnosis not present

## 2021-11-27 DIAGNOSIS — M25562 Pain in left knee: Secondary | ICD-10-CM | POA: Diagnosis not present

## 2021-12-06 DIAGNOSIS — R350 Frequency of micturition: Secondary | ICD-10-CM | POA: Diagnosis not present

## 2021-12-06 DIAGNOSIS — H8113 Benign paroxysmal vertigo, bilateral: Secondary | ICD-10-CM | POA: Diagnosis not present

## 2021-12-20 DIAGNOSIS — M1712 Unilateral primary osteoarthritis, left knee: Secondary | ICD-10-CM | POA: Diagnosis not present

## 2021-12-26 DIAGNOSIS — D519 Vitamin B12 deficiency anemia, unspecified: Secondary | ICD-10-CM | POA: Diagnosis not present

## 2021-12-27 DIAGNOSIS — M1712 Unilateral primary osteoarthritis, left knee: Secondary | ICD-10-CM | POA: Diagnosis not present

## 2022-01-03 DIAGNOSIS — M170 Bilateral primary osteoarthritis of knee: Secondary | ICD-10-CM | POA: Diagnosis not present

## 2022-01-22 DIAGNOSIS — E1129 Type 2 diabetes mellitus with other diabetic kidney complication: Secondary | ICD-10-CM | POA: Diagnosis not present

## 2022-01-22 DIAGNOSIS — M810 Age-related osteoporosis without current pathological fracture: Secondary | ICD-10-CM | POA: Diagnosis not present

## 2022-01-22 DIAGNOSIS — E785 Hyperlipidemia, unspecified: Secondary | ICD-10-CM | POA: Diagnosis not present

## 2022-01-22 DIAGNOSIS — D519 Vitamin B12 deficiency anemia, unspecified: Secondary | ICD-10-CM | POA: Diagnosis not present

## 2022-01-22 DIAGNOSIS — E039 Hypothyroidism, unspecified: Secondary | ICD-10-CM | POA: Diagnosis not present

## 2022-01-22 DIAGNOSIS — F03918 Unspecified dementia, unspecified severity, with other behavioral disturbance: Secondary | ICD-10-CM | POA: Diagnosis not present

## 2022-01-22 DIAGNOSIS — D509 Iron deficiency anemia, unspecified: Secondary | ICD-10-CM | POA: Diagnosis not present

## 2022-01-22 DIAGNOSIS — I480 Paroxysmal atrial fibrillation: Secondary | ICD-10-CM | POA: Diagnosis not present

## 2022-01-22 DIAGNOSIS — I10 Essential (primary) hypertension: Secondary | ICD-10-CM | POA: Diagnosis not present

## 2022-01-30 DIAGNOSIS — D519 Vitamin B12 deficiency anemia, unspecified: Secondary | ICD-10-CM | POA: Diagnosis not present

## 2022-02-02 DIAGNOSIS — M1712 Unilateral primary osteoarthritis, left knee: Secondary | ICD-10-CM | POA: Diagnosis not present

## 2022-02-02 DIAGNOSIS — M1711 Unilateral primary osteoarthritis, right knee: Secondary | ICD-10-CM | POA: Diagnosis not present

## 2022-02-09 DIAGNOSIS — M1711 Unilateral primary osteoarthritis, right knee: Secondary | ICD-10-CM | POA: Diagnosis not present

## 2022-03-08 DIAGNOSIS — D519 Vitamin B12 deficiency anemia, unspecified: Secondary | ICD-10-CM | POA: Diagnosis not present

## 2022-04-19 DIAGNOSIS — S73101A Unspecified sprain of right hip, initial encounter: Secondary | ICD-10-CM | POA: Diagnosis not present

## 2022-04-19 DIAGNOSIS — M1611 Unilateral primary osteoarthritis, right hip: Secondary | ICD-10-CM | POA: Diagnosis not present

## 2022-04-19 DIAGNOSIS — M25551 Pain in right hip: Secondary | ICD-10-CM | POA: Diagnosis not present

## 2022-04-19 DIAGNOSIS — M545 Low back pain, unspecified: Secondary | ICD-10-CM | POA: Diagnosis not present

## 2022-04-19 DIAGNOSIS — R296 Repeated falls: Secondary | ICD-10-CM | POA: Diagnosis not present

## 2022-04-19 DIAGNOSIS — E119 Type 2 diabetes mellitus without complications: Secondary | ICD-10-CM | POA: Diagnosis not present

## 2022-04-19 DIAGNOSIS — M159 Polyosteoarthritis, unspecified: Secondary | ICD-10-CM | POA: Diagnosis not present

## 2022-04-25 DIAGNOSIS — E039 Hypothyroidism, unspecified: Secondary | ICD-10-CM | POA: Diagnosis not present

## 2022-04-25 DIAGNOSIS — E1129 Type 2 diabetes mellitus with other diabetic kidney complication: Secondary | ICD-10-CM | POA: Diagnosis not present

## 2022-04-25 DIAGNOSIS — D509 Iron deficiency anemia, unspecified: Secondary | ICD-10-CM | POA: Diagnosis not present

## 2022-04-25 DIAGNOSIS — D519 Vitamin B12 deficiency anemia, unspecified: Secondary | ICD-10-CM | POA: Diagnosis not present

## 2022-04-25 DIAGNOSIS — I480 Paroxysmal atrial fibrillation: Secondary | ICD-10-CM | POA: Diagnosis not present

## 2022-04-25 DIAGNOSIS — E785 Hyperlipidemia, unspecified: Secondary | ICD-10-CM | POA: Diagnosis not present

## 2022-04-25 DIAGNOSIS — Z23 Encounter for immunization: Secondary | ICD-10-CM | POA: Diagnosis not present

## 2022-04-25 DIAGNOSIS — I10 Essential (primary) hypertension: Secondary | ICD-10-CM | POA: Diagnosis not present

## 2022-04-25 DIAGNOSIS — Z139 Encounter for screening, unspecified: Secondary | ICD-10-CM | POA: Diagnosis not present

## 2022-04-25 DIAGNOSIS — Z9181 History of falling: Secondary | ICD-10-CM | POA: Diagnosis not present

## 2022-04-25 DIAGNOSIS — M810 Age-related osteoporosis without current pathological fracture: Secondary | ICD-10-CM | POA: Diagnosis not present

## 2022-05-07 NOTE — Progress Notes (Deleted)
Patient ID: Melissa Jackson, female   DOB: 11-07-1940, 81 y.o.   MRN: [id number]     Cardiology Office Note   Date:  05/07/2022   ID:  Melissa Jackson, DOB [birth year], MRN [id number]  PCP:  Melissa Dress, MD  Cardiologist:   Jenkins Rouge, MD   No chief complaint on file.      History of Present Illness:  81 y.o. f/u CAD. Lives in [place] and sees Dr Melissa Jackson as primary. Previous patient of Dr Melissa Jackson. September 2014 presented with angina DES to LAD and Ramus with POB of D1 EF 55% PAF with SSS. Maintaining NSR on amiodarone Beta blocker dose has been decreased due to low HR's. Currently 12.5 mg lopressor bid.  Intolerant to statin with muscle wasting and pain Cough with ACE but tolerates ARB for BP control.   Admitted to hospital 03/18/19 with weakness and confusion UTI started on cipro 03/16/19 BS low 51 Rx dextrose Synthroid dose reduced for suppressed TSH  She seems to have more advanced dementia  Husband stopped her ASA due to nightly stomach upset on plavix instead   Given age  only using red yeast rice for cholesterol LDL 134 Cr 1.48 on labs with primary On 10/03/20   ***  Past Medical History:  Diagnosis Date   Acute myocardial infarction, subendocardial infarction, initial episode of care ([place])    Anemia    Anticoagulated    ON PRADAXA   Atrial fibrillation ([place])    Coronary artery disease    Diabetes mellitus with no complication (HCC)    Disease of tricuspid valve    MILD   Fall    First degree atrioventricular block    Head ache    High risk medication use    History of total hysterectomy    Hyperlipidemia    SEVERE   Hypertension, essential    Hypothyroidism    Left atrial enlargement    MILD   Mitral valvular disorder    MILD-MODERATE   Old myocardial infarction    JAN 2008   Takotsubo syndrome     Past Surgical History:  Procedure Laterality Date   APPENDECTOMY     COLONOSCOPY  03/28/2005   Moderate left colonic diverticulosis. Internal  hemorrhoids.   INCONTINENCE SURGERY     NO PAST SURGERIES     TOTAL ABDOMINAL HYSTERECTOMY       Current Outpatient Medications  Medication Sig Dispense Refill   alendronate (FOSAMAX) 70 MG tablet Take 70 mg by mouth once a week. Takes on fridays     amiodarone (PACERONE) 200 MG tablet Take 200 mg by mouth daily.     amLODipine (NORVASC) 5 MG tablet Take 5 mg by mouth daily.     chlorthalidone (HYGROTON) 25 MG tablet Take 25 mg by mouth every morning.     clopidogrel (PLAVIX) 75 MG tablet Take 75 mg by mouth daily.     ferrous sulfate 325 (65 FE) MG tablet Take 325 mg by mouth 3 (three) times daily with meals.     levothyroxine (SYNTHROID) 100 MCG tablet Take 100 mcg by mouth daily.     levothyroxine (SYNTHROID) 112 MCG tablet Take 1 tablet (112 mcg total) by mouth daily before breakfast. 30 tablet 0   losartan (COZAAR) 25 MG tablet Take 1 tablet (25 mg total) by mouth daily. 90 tablet 1   metFORMIN (GLUCOPHAGE) 1000 MG tablet Take 1,000 mg by mouth 2 (two) times daily. (Patient not taking: Reported on 10/19/2021)  metoprolol tartrate (LOPRESSOR) 25 MG tablet Take 0.5 tablets (12.5 mg total) by mouth 2 (two) times daily. 30 tablet 0   Multiple Vitamins-Minerals (MULTIVITAMIN WITH MINERALS) tablet Take 1 tablet by mouth daily.     nitroGLYCERIN (NITROSTAT) 0.4 MG SL tablet Place 0.4 mg under the tongue every 5 (five) minutes as needed for chest pain.     Omega 3 1000 MG CAPS Take 2 capsules by mouth 2 (two) times daily.     pravastatin (PRAVACHOL) 20 MG tablet as directed.  5   Red Yeast Rice 600 MG CAPS Take 1 capsule by mouth daily.      No current facility-administered medications for this visit.    Allergies:   Lisinopril, Onion, Codeine, Morphine and related, and Statins    Social History:  The patient  reports that she has never smoked. She has never used smokeless tobacco. She reports that she does not drink alcohol and does not use drugs.   Family History:  The patient's  family history includes Alzheimer's disease in her mother; Diabetes Mellitus I in her mother; Heart attack in her father.    ROS:  Please see the history of present illness.   Otherwise, review of systems are positive for none.   All other systems are reviewed and negative.    PHYSICAL EXAM: VS:  LMP  (LMP Unknown)  , BMI There is no height or weight on file to calculate BMI. Affect appropriate Healthy:  appears stated age 4: normal Neck supple with no adenopathy JVP normal no bruits no thyromegaly Lungs clear with no wheezing and good diaphragmatic motion Heart:  S1/S2 no murmur, no rub, gallop or click PMI normal Abdomen: benighn, BS positve, no tenderness, no AAA no bruit.  No HSM or HJR Distal pulses intact with no bruits No edema Neuro non-focal Skin warm and dry No muscular weakness   EKG: 10/10/17 SR rate 58 nonspecific ST changes 05/07/2022 SR rate 54 inferior lateral T wave changes  05/07/2022 SR rate 55 nonspecific ST changes   Recent Labs: No results found for requested labs within last 365 days.    Lipid Panel No results found for: "CHOL", "TRIG", "HDL", "CHOLHDL", "VLDL", "LDLCALC", "LDLDIRECT"    Wt Readings from Last 3 Encounters:  10/19/21 119 lb (54 kg)  05/12/21 111 lb 3.2 oz (50.4 kg)  09/09/20 99 lb 6.4 oz (45.1 kg)      Other studies Reviewed: Additional studies/ records that were reviewed today include: Records from [place], [place], [place] and [place] .Cath 2014 and echo 2014     ASSESSMENT AND PLAN:  1. CAD:  Given complexity of intervention 2014 continue Plavix stomach upset with ASA .  No angina medical Rx 2. PAF:  Continue low dose amiodarone. LFT;s/TSH normal 07/2020  PFTls/ DLCO normal 12/16/19    She is elderly with dementia and has been in NSR for year so no anticoagulation  3. SSS:  Lopressor dose decreased  no syncopal symptoms or HB 4. Chol:  Discussed taking 1200 mg red yeast rice daly  intolerant statin  5. HTN:  With DM and  significant CAD on ARB tolerating well Significant white coat component She assures me home readings are fine always high in our office  6. Thyroid TSH normal 07/2020   7. Confusion: related to low BS and UTI resolved component dementia   F/U in 6 months   Signed, Jenkins Rouge, MD  05/07/2022 4:08 PM    [facility] [address] [place], [place]  [address] Phone: [phone]; Fax: [phone]

## 2022-05-17 ENCOUNTER — Ambulatory Visit: Payer: Medicare Other | Admitting: Cardiovascular Disease

## 2022-06-25 DIAGNOSIS — H00036 Abscess of eyelid left eye, unspecified eyelid: Secondary | ICD-10-CM | POA: Diagnosis not present

## 2022-06-25 DIAGNOSIS — H00033 Abscess of eyelid right eye, unspecified eyelid: Secondary | ICD-10-CM | POA: Diagnosis not present

## 2022-07-09 ENCOUNTER — Inpatient Hospital Stay (HOSPITAL_COMMUNITY)
Admission: EM | Admit: 2022-07-09 | Discharge: 2022-07-11 | DRG: 690 | Disposition: A | Discharge status: Home / Self Care | Payer: Medicare Other | Attending: Internal Medicine | Admitting: Internal Medicine

## 2022-07-09 ENCOUNTER — Emergency Department (HOSPITAL_COMMUNITY): Payer: Medicare Other

## 2022-07-09 ENCOUNTER — Other Ambulatory Visit: Payer: Self-pay

## 2022-07-09 ENCOUNTER — Encounter (HOSPITAL_COMMUNITY): Payer: Self-pay | Admitting: Internal Medicine

## 2022-07-09 DIAGNOSIS — Z7902 Long term (current) use of antithrombotics/antiplatelets: Secondary | ICD-10-CM

## 2022-07-09 DIAGNOSIS — F0390 Unspecified dementia without behavioral disturbance: Secondary | ICD-10-CM | POA: Diagnosis present

## 2022-07-09 DIAGNOSIS — I4891 Unspecified atrial fibrillation: Secondary | ICD-10-CM | POA: Diagnosis present

## 2022-07-09 DIAGNOSIS — E039 Hypothyroidism, unspecified: Secondary | ICD-10-CM

## 2022-07-09 DIAGNOSIS — I252 Old myocardial infarction: Secondary | ICD-10-CM

## 2022-07-09 DIAGNOSIS — Z66 Do not resuscitate: Secondary | ICD-10-CM | POA: Diagnosis not present

## 2022-07-09 DIAGNOSIS — I440 Atrioventricular block, first degree: Secondary | ICD-10-CM | POA: Diagnosis not present

## 2022-07-09 DIAGNOSIS — Z0389 Encounter for observation for other suspected diseases and conditions ruled out: Secondary | ICD-10-CM | POA: Diagnosis not present

## 2022-07-09 DIAGNOSIS — Z743 Need for continuous supervision: Secondary | ICD-10-CM | POA: Diagnosis not present

## 2022-07-09 DIAGNOSIS — R6883 Chills (without fever): Secondary | ICD-10-CM | POA: Diagnosis not present

## 2022-07-09 DIAGNOSIS — Z820 Family history of epilepsy and other diseases of the nervous system: Secondary | ICD-10-CM

## 2022-07-09 DIAGNOSIS — B9620 Unspecified Escherichia coli [E. coli] as the cause of diseases classified elsewhere: Secondary | ICD-10-CM | POA: Diagnosis present

## 2022-07-09 DIAGNOSIS — N184 Chronic kidney disease, stage 4 (severe): Secondary | ICD-10-CM | POA: Diagnosis present

## 2022-07-09 DIAGNOSIS — I10 Essential (primary) hypertension: Secondary | ICD-10-CM | POA: Diagnosis not present

## 2022-07-09 DIAGNOSIS — Z1152 Encounter for screening for COVID-19: Secondary | ICD-10-CM | POA: Diagnosis not present

## 2022-07-09 DIAGNOSIS — E1122 Type 2 diabetes mellitus with diabetic chronic kidney disease: Secondary | ICD-10-CM | POA: Diagnosis present

## 2022-07-09 DIAGNOSIS — Z789 Other specified health status: Secondary | ICD-10-CM | POA: Diagnosis not present

## 2022-07-09 DIAGNOSIS — R6889 Other general symptoms and signs: Secondary | ICD-10-CM | POA: Diagnosis not present

## 2022-07-09 DIAGNOSIS — I480 Paroxysmal atrial fibrillation: Secondary | ICD-10-CM | POA: Diagnosis not present

## 2022-07-09 DIAGNOSIS — E119 Type 2 diabetes mellitus without complications: Secondary | ICD-10-CM | POA: Diagnosis not present

## 2022-07-09 DIAGNOSIS — Z8249 Family history of ischemic heart disease and other diseases of the circulatory system: Secondary | ICD-10-CM

## 2022-07-09 DIAGNOSIS — E785 Hyperlipidemia, unspecified: Secondary | ICD-10-CM | POA: Diagnosis not present

## 2022-07-09 DIAGNOSIS — N390 Urinary tract infection, site not specified: Principal | ICD-10-CM | POA: Diagnosis present

## 2022-07-09 DIAGNOSIS — F03C0 Unspecified dementia, severe, without behavioral disturbance, psychotic disturbance, mood disturbance, and anxiety: Secondary | ICD-10-CM | POA: Diagnosis not present

## 2022-07-09 DIAGNOSIS — D631 Anemia in chronic kidney disease: Secondary | ICD-10-CM | POA: Diagnosis not present

## 2022-07-09 DIAGNOSIS — R410 Disorientation, unspecified: Secondary | ICD-10-CM | POA: Diagnosis not present

## 2022-07-09 DIAGNOSIS — Z781 Physical restraint status: Secondary | ICD-10-CM | POA: Diagnosis not present

## 2022-07-09 DIAGNOSIS — E872 Acidosis, unspecified: Secondary | ICD-10-CM | POA: Diagnosis not present

## 2022-07-09 DIAGNOSIS — Z79890 Hormone replacement therapy (postmenopausal): Secondary | ICD-10-CM

## 2022-07-09 DIAGNOSIS — Z7189 Other specified counseling: Secondary | ICD-10-CM | POA: Diagnosis not present

## 2022-07-09 DIAGNOSIS — Z91018 Allergy to other foods: Secondary | ICD-10-CM

## 2022-07-09 DIAGNOSIS — N3000 Acute cystitis without hematuria: Secondary | ICD-10-CM | POA: Diagnosis not present

## 2022-07-09 DIAGNOSIS — N289 Disorder of kidney and ureter, unspecified: Secondary | ICD-10-CM | POA: Diagnosis not present

## 2022-07-09 DIAGNOSIS — Z79899 Other long term (current) drug therapy: Secondary | ICD-10-CM

## 2022-07-09 DIAGNOSIS — Z833 Family history of diabetes mellitus: Secondary | ICD-10-CM | POA: Diagnosis not present

## 2022-07-09 DIAGNOSIS — Z7984 Long term (current) use of oral hypoglycemic drugs: Secondary | ICD-10-CM

## 2022-07-09 DIAGNOSIS — Z888 Allergy status to other drugs, medicaments and biological substances status: Secondary | ICD-10-CM

## 2022-07-09 DIAGNOSIS — D649 Anemia, unspecified: Secondary | ICD-10-CM

## 2022-07-09 DIAGNOSIS — N179 Acute kidney failure, unspecified: Secondary | ICD-10-CM | POA: Diagnosis not present

## 2022-07-09 DIAGNOSIS — Z885 Allergy status to narcotic agent status: Secondary | ICD-10-CM | POA: Diagnosis not present

## 2022-07-09 DIAGNOSIS — I129 Hypertensive chronic kidney disease with stage 1 through stage 4 chronic kidney disease, or unspecified chronic kidney disease: Secondary | ICD-10-CM | POA: Diagnosis present

## 2022-07-09 DIAGNOSIS — F03C11 Unspecified dementia, severe, with agitation: Secondary | ICD-10-CM | POA: Diagnosis present

## 2022-07-09 DIAGNOSIS — Z90710 Acquired absence of both cervix and uterus: Secondary | ICD-10-CM

## 2022-07-09 DIAGNOSIS — R0902 Hypoxemia: Secondary | ICD-10-CM | POA: Diagnosis not present

## 2022-07-09 DIAGNOSIS — I2510 Atherosclerotic heart disease of native coronary artery without angina pectoris: Secondary | ICD-10-CM | POA: Diagnosis present

## 2022-07-09 DIAGNOSIS — Z515 Encounter for palliative care: Secondary | ICD-10-CM | POA: Diagnosis not present

## 2022-07-09 DIAGNOSIS — R159 Full incontinence of feces: Secondary | ICD-10-CM | POA: Diagnosis present

## 2022-07-09 LAB — CBC WITH DIFFERENTIAL/PLATELET
Abs Immature Granulocytes: 0.03 10*3/uL (ref 0.00–0.07)
Basophils Absolute: 0 10*3/uL (ref 0.0–0.1)
Basophils Relative: 0 %
Eosinophils Absolute: 0 10*3/uL (ref 0.0–0.5)
Eosinophils Relative: 0 %
HCT: 30.9 % — ABNORMAL LOW (ref 36.0–46.0)
Hemoglobin: 9.6 g/dL — ABNORMAL LOW (ref 12.0–15.0)
Immature Granulocytes: 0 %
Lymphocytes Relative: 3 %
Lymphs Abs: 0.3 10*3/uL — ABNORMAL LOW (ref 0.7–4.0)
MCH: 28.9 pg (ref 26.0–34.0)
MCHC: 31.1 g/dL (ref 30.0–36.0)
MCV: 93.1 fL (ref 80.0–100.0)
Monocytes Absolute: 0.3 10*3/uL (ref 0.1–1.0)
Monocytes Relative: 3 %
Neutro Abs: 8.8 10*3/uL — ABNORMAL HIGH (ref 1.7–7.7)
Neutrophils Relative %: 94 %
Platelets: 211 10*3/uL (ref 150–400)
RBC: 3.32 MIL/uL — ABNORMAL LOW (ref 3.87–5.11)
RDW: 15.2 % (ref 11.5–15.5)
WBC: 9.4 10*3/uL (ref 4.0–10.5)
nRBC: 0 % (ref 0.0–0.2)

## 2022-07-09 LAB — URINALYSIS, W/ REFLEX TO CULTURE (INFECTION SUSPECTED)
Bilirubin Urine: NEGATIVE
Glucose, UA: 50 mg/dL — AB
Ketones, ur: NEGATIVE mg/dL
Nitrite: NEGATIVE
Protein, ur: 30 mg/dL — AB
Specific Gravity, Urine: 1.008 (ref 1.005–1.030)
WBC, UA: >50 WBC/hpf (ref 0–5)
pH: 6 (ref 5.0–8.0)

## 2022-07-09 LAB — GLUCOSE, CAPILLARY
Glucose-Capillary: 129 mg/dL — ABNORMAL HIGH (ref 70–99)
Glucose-Capillary: 145 mg/dL — ABNORMAL HIGH (ref 70–99)
Glucose-Capillary: 172 mg/dL — ABNORMAL HIGH (ref 70–99)

## 2022-07-09 LAB — LACTIC ACID, PLASMA
Lactic Acid, Venous: 1.5 mmol/L (ref 0.5–1.9)
Lactic Acid, Venous: 2.5 mmol/L (ref 0.5–1.9)

## 2022-07-09 LAB — PROTIME-INR
INR: 1.1 (ref 0.8–1.2)
Prothrombin Time: 14.1 seconds (ref 11.4–15.2)

## 2022-07-09 LAB — TSH: TSH: 1.617 u[IU]/mL (ref 0.350–4.500)

## 2022-07-09 LAB — HEMOGLOBIN A1C
Hgb A1c MFr Bld: 6.5 % — ABNORMAL HIGH (ref 4.8–5.6)
Mean Plasma Glucose: 139.85 mg/dL

## 2022-07-09 LAB — COMPREHENSIVE METABOLIC PANEL
ALT: 10 U/L (ref 0–44)
AST: 18 U/L (ref 15–41)
Albumin: 3 g/dL — ABNORMAL LOW (ref 3.5–5.0)
Alkaline Phosphatase: 56 U/L (ref 38–126)
Anion gap: 10 (ref 5–15)
BUN: 26 mg/dL — ABNORMAL HIGH (ref 8–23)
CO2: 17 mmol/L — ABNORMAL LOW (ref 22–32)
Calcium: 7.6 mg/dL — ABNORMAL LOW (ref 8.9–10.3)
Chloride: 108 mmol/L (ref 98–111)
Creatinine, Ser: 1.9 mg/dL — ABNORMAL HIGH (ref 0.44–1.00)
GFR, Estimated: 26 mL/min — ABNORMAL LOW (ref >60)
Glucose, Bld: 151 mg/dL — ABNORMAL HIGH (ref 70–99)
Potassium: 4 mmol/L (ref 3.5–5.1)
Sodium: 135 mmol/L (ref 135–145)
Total Bilirubin: 0.8 mg/dL (ref 0.3–1.2)
Total Protein: 5.6 g/dL — ABNORMAL LOW (ref 6.5–8.1)

## 2022-07-09 LAB — RESP PANEL BY RT-PCR (RSV, FLU A&B, COVID)  RVPGX2
Influenza A by PCR: NEGATIVE
Influenza B by PCR: NEGATIVE
Resp Syncytial Virus by PCR: NEGATIVE
SARS Coronavirus 2 by RT PCR: NEGATIVE

## 2022-07-09 LAB — CBG MONITORING, ED: Glucose-Capillary: 127 mg/dL — ABNORMAL HIGH (ref 70–99)

## 2022-07-09 LAB — APTT: aPTT: 27 seconds (ref 24–36)

## 2022-07-09 MED ORDER — ACETAMINOPHEN 325 MG PO TABS
650.0000 mg | ORAL_TABLET | Freq: Four times a day (QID) | ORAL | Status: DC | PRN
  Administered 2022-07-10 (×2): 650 mg via ORAL
  Filled 2022-07-09 (×2): qty 2

## 2022-07-09 MED ORDER — ENOXAPARIN SODIUM 30 MG/0.3ML IJ SOSY
30.0000 mg | PREFILLED_SYRINGE | INTRAMUSCULAR | Status: DC
  Administered 2022-07-10 – 2022-07-11 (×2): 30 mg via SUBCUTANEOUS
  Filled 2022-07-09 (×2): qty 0.3

## 2022-07-09 MED ORDER — ENOXAPARIN SODIUM 40 MG/0.4ML IJ SOSY
40.0000 mg | PREFILLED_SYRINGE | INTRAMUSCULAR | Status: DC
  Administered 2022-07-09: 40 mg via SUBCUTANEOUS
  Filled 2022-07-09: qty 0.4

## 2022-07-09 MED ORDER — LACTATED RINGERS IV SOLN: INTRAVENOUS | Status: AC

## 2022-07-09 MED ORDER — RISPERIDONE 0.5 MG PO TABS
0.5000 mg | ORAL_TABLET | Freq: Two times a day (BID) | ORAL | Status: DC
  Administered 2022-07-09 – 2022-07-11 (×5): 0.5 mg via ORAL
  Filled 2022-07-09 (×7): qty 1

## 2022-07-09 MED ORDER — SODIUM CHLORIDE 0.9 % IV SOLN
2.0000 g | INTRAVENOUS | Status: DC
  Administered 2022-07-10 – 2022-07-11 (×2): 2 g via INTRAVENOUS
  Filled 2022-07-09 (×2): qty 20

## 2022-07-09 MED ORDER — HALOPERIDOL LACTATE 5 MG/ML IJ SOLN
2.0000 mg | Freq: Four times a day (QID) | INTRAMUSCULAR | Status: DC | PRN
  Administered 2022-07-09 – 2022-07-10 (×2): 2 mg via INTRAVENOUS
  Filled 2022-07-09 (×2): qty 1

## 2022-07-09 MED ORDER — ONDANSETRON HCL 4 MG PO TABS: 4.0000 mg | ORAL_TABLET | Freq: Four times a day (QID) | ORAL | Status: DC | PRN

## 2022-07-09 MED ORDER — ORAL CARE MOUTH RINSE
15.0000 mL | OROMUCOSAL | Status: DC
  Administered 2022-07-09 – 2022-07-11 (×5): 15 mL via OROMUCOSAL

## 2022-07-09 MED ORDER — ACETAMINOPHEN 325 MG PO TABS
650.0000 mg | ORAL_TABLET | Freq: Once | ORAL | Status: AC
  Administered 2022-07-09: 650 mg via ORAL
  Filled 2022-07-09: qty 2

## 2022-07-09 MED ORDER — AMLODIPINE BESYLATE 5 MG PO TABS
5.0000 mg | ORAL_TABLET | Freq: Every day | ORAL | Status: DC
  Administered 2022-07-09 – 2022-07-11 (×3): 5 mg via ORAL
  Filled 2022-07-09 (×3): qty 1

## 2022-07-09 MED ORDER — SODIUM CHLORIDE 0.9 % IV SOLN
2.0000 g | Freq: Once | INTRAVENOUS | Status: AC
  Administered 2022-07-09: 2 g via INTRAVENOUS
  Filled 2022-07-09: qty 20

## 2022-07-09 MED ORDER — AMIODARONE HCL 200 MG PO TABS
200.0000 mg | ORAL_TABLET | Freq: Every day | ORAL | Status: DC
  Administered 2022-07-09 – 2022-07-11 (×3): 200 mg via ORAL
  Filled 2022-07-09 (×3): qty 1

## 2022-07-09 MED ORDER — INSULIN ASPART 100 UNIT/ML IJ SOLN
0.0000 [IU] | Freq: Three times a day (TID) | INTRAMUSCULAR | Status: DC
  Administered 2022-07-09 – 2022-07-10 (×4): 1 [IU] via SUBCUTANEOUS
  Administered 2022-07-10: 2 [IU] via SUBCUTANEOUS

## 2022-07-09 MED ORDER — ORAL CARE MOUTH RINSE: 15.0000 mL | OROMUCOSAL | Status: DC | PRN

## 2022-07-09 MED ORDER — INSULIN GLARGINE-YFGN 100 UNIT/ML ~~LOC~~ SOLN
5.0000 [IU] | Freq: Every evening | SUBCUTANEOUS | Status: DC
  Administered 2022-07-09 – 2022-07-10 (×2): 5 [IU] via SUBCUTANEOUS
  Filled 2022-07-09 (×3): qty 0.05

## 2022-07-09 MED ORDER — HALOPERIDOL LACTATE 5 MG/ML IJ SOLN
5.0000 mg | Freq: Once | INTRAMUSCULAR | Status: AC
  Administered 2022-07-09: 5 mg via INTRAVENOUS
  Filled 2022-07-09: qty 1

## 2022-07-09 MED ORDER — ONDANSETRON HCL 4 MG/2ML IJ SOLN: 4.0000 mg | Freq: Four times a day (QID) | INTRAMUSCULAR | Status: DC | PRN

## 2022-07-09 MED ORDER — LORAZEPAM 1 MG PO TABS: 1.0000 mg | ORAL_TABLET | Freq: Every evening | ORAL | Status: DC | PRN

## 2022-07-09 MED ORDER — ACETAMINOPHEN 650 MG RE SUPP: 650.0000 mg | Freq: Four times a day (QID) | RECTAL | Status: DC | PRN

## 2022-07-09 MED ORDER — DONEPEZIL HCL 10 MG PO TABS
10.0000 mg | ORAL_TABLET | Freq: Every day | ORAL | Status: DC
  Administered 2022-07-09 – 2022-07-10 (×2): 10 mg via ORAL
  Filled 2022-07-09 (×2): qty 1

## 2022-07-09 MED ORDER — ERYTHROMYCIN 5 MG/GM OP OINT
1.0000 | TOPICAL_OINTMENT | Freq: Four times a day (QID) | OPHTHALMIC | Status: DC
  Administered 2022-07-09 – 2022-07-11 (×9): 1 via OPHTHALMIC
  Filled 2022-07-09: qty 3.5

## 2022-07-09 MED ORDER — CLOPIDOGREL BISULFATE 75 MG PO TABS
75.0000 mg | ORAL_TABLET | Freq: Every day | ORAL | Status: DC
  Administered 2022-07-09 – 2022-07-11 (×3): 75 mg via ORAL
  Filled 2022-07-09 (×3): qty 1

## 2022-07-09 MED ORDER — QUETIAPINE FUMARATE 25 MG PO TABS
25.0000 mg | ORAL_TABLET | Freq: Every day | ORAL | Status: DC
  Administered 2022-07-09: 25 mg via ORAL
  Filled 2022-07-09: qty 1

## 2022-07-09 MED ORDER — LEVOTHYROXINE SODIUM 100 MCG PO TABS
100.0000 ug | ORAL_TABLET | Freq: Every day | ORAL | Status: DC
  Administered 2022-07-09 – 2022-07-11 (×3): 100 ug via ORAL
  Filled 2022-07-09 (×3): qty 1

## 2022-07-09 NOTE — Assessment & Plan Note (Addendum)
Seems to have very advanced dementia at baseline.  Can't really tell if she is any more confused than baseline today given how advanced dementia is per husband.  AAO to self only but seems that this is baseline per husband.Marland Kitchen

## 2022-07-09 NOTE — Progress Notes (Signed)
  Transition of Care [facility]) Screening Note   Patient Details  Name: Melissa Jackson Date of Birth: Nov 05, 1940   Transition of Care [facility]) CM/SW Contact:    Tom-Johnson, Renea Ee, RN Phone Number: 07/09/2022, 11:57 AM  Patient is admitted for UTI, on IV abx. Patient is from home with husband. Has six supportive children, three at bedside at this time. Has hx of Dementia, answered my questions appropriately. Has a walker, rollator and a left leg brace.  PCP is [name], MD and uses [facility] on [address] in [place].   Transition of Care Department [facility]) has reviewed patient and no TOC needs or recommendations have been identified at this time. TOC will continue to monitor patient advancement through interdisciplinary progression rounds. If new patient transition needs arise, please place a TOC consult.

## 2022-07-09 NOTE — Assessment & Plan Note (Signed)
Cont home synthroid once med rec completed. Will check TSH as well.

## 2022-07-09 NOTE — Care Management Obs Status (Signed)
[facility] NOTIFICATION   Patient Details  Name: Melissa Jackson MRN: [id number] Date of Birth: 01-13-1941   Medicare Observation Status Notification Given:  Yes    Tom-Johnson, Renea Ee, RN 07/09/2022, 1:50 PM

## 2022-07-09 NOTE — Assessment & Plan Note (Addendum)
Cont low dose degludec. Sensitive SSI AC.

## 2022-07-09 NOTE — Assessment & Plan Note (Addendum)
Pt with UTI based on UA + fever to 101.8. Empiric rocephin UCx pending BCx pending Tylenol PRN fever

## 2022-07-09 NOTE — Evaluation (Signed)
Physical Therapy Evaluation - and d/c  Patient Details Name: Melissa Jackson MRN: [id number] DOB: Oct 07, 1940 Today's Date: 07/09/2022  History of Present Illness  82 y.o. female adm 2/5 with medical history significant of severe dementia, PAF no longer on AC, HTN, HLD, DM2.  CKD 3 with creat 1.5 as of May 2023.   She was admitted with UTI as well as AKI.  Clinical Impression   Pt presents with generalized weakness, difficulty transferring without physical assist, and decreased activity tolerance, all of which pt's husband states is baseline. Pt requiring mod physical assist for transfer into standing x2, pt's husband assisting with second transfer to ensure pt is at baseline and he is able to assist her at home for at least transfer-level mobility. Pt states he is able to assist her at home, and strongly declines any follow up therapies at this time. PT to sign off.         Recommendations for follow up therapy are one component of a multi-disciplinary discharge planning process, led by the attending physician.  Recommendations may be updated based on patient status, additional functional criteria and insurance authorization.  Follow Up Recommendations No PT follow up (per pt's husband's request)      Assistance Recommended at Discharge Frequent or constant Supervision/Assistance  Patient can return home with the following  A lot of help with walking and/or transfers;A lot of help with bathing/dressing/bathroom    Equipment Recommendations None recommended by PT  Recommendations for Other Services       Functional Status Assessment       Precautions / Restrictions Precautions Precautions: Fall Precaution Comments: Arthritic knees and shoulders, wears L knee brace at home during transfers and short-distance gait with rollator Restrictions Weight Bearing Restrictions: No      Mobility  Bed Mobility Overal bed mobility: Needs Assistance             General bed mobility  comments: up in chair    Transfers Overall transfer level: Needs assistance Equipment used: 1 person hand held assist Transfers: Sit to/from Stand Sit to Stand: Mod assist           General transfer comment: assist for power up, rise, steadying at hips. STS x2, once with PT assisting and once with husband assisting to ensure pt's husband able to help pt at home    Ambulation/Gait               General Gait Details: nt - pt does not have L knee brace  Stairs            Wheelchair Mobility    Modified Rankin (Stroke Patients Only)       Balance Overall balance assessment: Needs assistance Sitting-balance support: Feet supported Sitting balance-Leahy Scale: Fair     Standing balance support: Bilateral upper extremity supported Standing balance-Leahy Scale: Poor                               Pertinent Vitals/Pain Pain Assessment Pain Assessment: No/denies pain Pain Intervention(s): Monitored during session    Home Living Family/patient expects to be discharged to:: Private residence Living Arrangements: Spouse/significant other Available Help at Discharge: Family;Available 24 hours/day Type of Home: House Home Access: Stairs to enter   [employer] of Steps: 1   Home Layout: One level Home Equipment: Conservation officer, nature (2 wheels);Rollator (4 wheels);Transport chair;BSC/3in1      Prior Function Prior Level of Function :  Needs assist  Cognitive Assist : ADLs (cognitive)   ADLs (Cognitive): Intermittent cues Physical Assist : ADLs (physical)   ADLs (physical): Bathing;Dressing;Toileting;IADLs Mobility Comments: Spouse assists with transfers to transport chair and 4WRW. Pt's husband states pt can ambulate short distances with rollator ADLs Comments: Spouse assists with all self care, iADL, medications, community mobility     Hand Dominance   Dominant Hand: Right    Extremity/Trunk Assessment   Upper Extremity  Assessment Upper Extremity Assessment: Defer to OT evaluation    Lower Extremity Assessment Lower Extremity Assessment: Generalized weakness;LLE deficits/detail LLE Deficits / Details: knee varus deformity    Cervical / Trunk Assessment Cervical / Trunk Assessment: Kyphotic  Communication   Communication: No difficulties  Cognition Arousal/Alertness: Awake/alert Behavior During Therapy: WFL for tasks assessed/performed Overall Cognitive Status: History of cognitive impairments - at baseline                                 General Comments: history of dementia, pleasant        General Comments      Exercises     Assessment/Plan    PT Assessment Patient does not need any further PT services  PT Problem List Decreased strength;Decreased activity tolerance;Decreased balance       PT Treatment Interventions  (n/a)    PT Goals (Current goals can be found in the Care Plan section)  Acute Rehab PT Goals Patient Stated Goal: go home PT Goal Formulation: With patient/family Time For Goal Achievement: 07/09/22 Potential to Achieve Goals: Good    Frequency  (n/a)     Co-evaluation               AM-PAC PT "6 Clicks" Mobility  Outcome Measure Help needed turning from your back to your side while in a flat bed without using bedrails?: A Little Help needed moving from lying on your back to sitting on the side of a flat bed without using bedrails?: A Little Help needed moving to and from a bed to a chair (including a wheelchair)?: A Lot Help needed standing up from a chair using your arms (e.g., wheelchair or bedside chair)?: A Lot Help needed to walk in hospital room?: Total Help needed climbing 3-5 steps with a railing? : Total 6 Click Score: 12    End of Session   Activity Tolerance: Patient tolerated treatment well Patient left: in chair;with call bell/phone within reach;Other (comment);with family/visitor present (pt's husband, daughter, and son  present) Nurse Communication: Mobility status PT Visit Diagnosis: Other abnormalities of gait and mobility (R26.89);History of falling (Z91.81)    Time: 9381-8299 PT Time Calculation (min) (ACUTE ONLY): 10 min   Charges:   PT Evaluation $PT Eval Low Complexity: 1 Low          Laqueisha Catalina S, PT DPT Acute Rehabilitation Services Pager [phone]  Office [phone]   Louis Matte 07/09/2022, 4:56 PM

## 2022-07-09 NOTE — Assessment & Plan Note (Signed)
Cont amiodarone Cont BB Elderly with dementia, long term NSR as far as we are aware so no AC as per Cards office note.

## 2022-07-09 NOTE — ED Triage Notes (Signed)
Pt arrived from home, c/o chills/fever/nausea. Per EMS initial temp was 100.3 axillary. Pt denies CP/SOB. Hx of Dementia

## 2022-07-09 NOTE — ED Notes (Signed)
ED TO INPATIENT HANDOFF REPORT  ED Nurse Name and Phone # [phone]  S Name/Age/Gender Melissa Jackson 82 y.o. female Room/Bed: 035C/035C  Code Status   Code Status: Full Code  Home/SNF/Other Home Patient oriented to: self, place, time, and situation Is this baseline? Yes   Triage Complete: Triage complete  Chief Complaint UTI (urinary tract infection) [N39.0]  Triage Note Pt arrived from home, c/o chills/fever/nausea. Per EMS initial temp was 100.3 axillary. Pt denies CP/SOB. Hx of Dementia   Allergies Allergies  Allergen Reactions   Lisinopril Cough   Onion Other (See Comments)    "Put her in the hospital, make her sick as a dog" per husband at bedside.   Codeine Diarrhea   Morphine And Related Other (See Comments)    GI UPSET   Statins Other (See Comments)    ARTHRALGIA     Level of Care/Admitting Diagnosis ED Disposition     ED Disposition  Admit   Condition  --   Comment  Hospital Area: [facility] [[id number]]  Level of Care: Med-Surg [16]  May place patient in observation at [facility] or [place] if equivalent level of care is available:: No  Covid Evaluation: Asymptomatic - no recent exposure (last 10 days) testing not required  Diagnosis: UTI (urinary tract infection) [546270]  Admitting Physician: Etta Quill [phone]  Attending Physician: Etta Quill [[id number]]          B Medical/Surgery History Past Medical History:  Diagnosis Date   Acute myocardial infarction, subendocardial infarction, initial episode of care ([place])    Anemia    Anticoagulated    ON PRADAXA   Atrial fibrillation ([place])    Coronary artery disease    Diabetes mellitus with no complication ([place])    Disease of tricuspid valve    MILD   Fall    First degree atrioventricular block    Head ache    High risk medication use    History of total hysterectomy    Hyperlipidemia    SEVERE   Hypertension, essential    Hypothyroidism    Left  atrial enlargement    MILD   Mitral valvular disorder    MILD-MODERATE   Old myocardial infarction    JAN 2008   Takotsubo syndrome    Past Surgical History:  Procedure Laterality Date   APPENDECTOMY     COLONOSCOPY  03/28/2005   Moderate left colonic diverticulosis. Internal hemorrhoids.   INCONTINENCE SURGERY     NO PAST SURGERIES     TOTAL ABDOMINAL HYSTERECTOMY       A IV Location/Drains/Wounds Patient Lines/Drains/Airways Status     Active Line/Drains/Airways     Name Placement date Placement time Site Days   Peripheral IV 07/09/22 20 G Right Forearm 07/09/22  --  Forearm  less than 1            Intake/Output Last 24 hours  Intake/Output Summary (Last 24 hours) at 07/09/2022 9381 Last data filed at 07/09/2022 0531 Gross per 24 hour  Intake 1001.8 ml  Output --  Net 1001.8 ml    Labs/Imaging Results for orders placed or performed during the hospital encounter of 07/09/22 (from the past 48 hour(s))  Urinalysis, w/ Reflex to Culture (Infection Suspected) -Urine, Catheterized     Status: Abnormal   Collection Time: 07/09/22  2:53 AM  Result Value Ref Range   Specimen Source URINE, CATHETERIZED    Color, Urine YELLOW YELLOW   APPearance HAZY (A) CLEAR  Specific Gravity, Urine 1.008 1.005 - 1.030   pH 6.0 5.0 - 8.0   Glucose, UA 50 (A) NEGATIVE mg/dL   Hgb urine dipstick MODERATE (A) NEGATIVE   Bilirubin Urine NEGATIVE NEGATIVE   Ketones, ur NEGATIVE NEGATIVE mg/dL   Protein, ur 30 (A) NEGATIVE mg/dL   Nitrite NEGATIVE NEGATIVE   Leukocytes,Ua MODERATE (A) NEGATIVE   RBC / HPF 6-10 0 - 5 RBC/hpf   WBC, UA >50 0 - 5 WBC/hpf    Comment:        Reflex urine culture not performed if WBC <=10, OR if Squamous epithelial cells >5. If Squamous epithelial cells >5 suggest recollection.    Bacteria, UA FEW (A) NONE SEEN   Squamous Epithelial / HPF 0-5 0 - 5 /HPF    Comment: Performed at [facility], [place] [address], [place], [place] [address]  Urine  Culture     Status: None (Preliminary result)   Collection Time: 07/09/22  2:53 AM   Specimen: Urine, Catheterized  Result Value Ref Range   Specimen Description URINE, CATHETERIZED    Special Requests      NONE Reflexed from [id number] Performed at [facility], [place] [address] [place], [place] [address]    Culture PENDING    Report Status PENDING   Resp panel by RT-PCR (RSV, Flu A&B, Covid) Urine, Catheterized     Status: None   Collection Time: 07/09/22  2:57 AM   Specimen: Urine, Catheterized; Nasal Swab  Result Value Ref Range   SARS Coronavirus 2 by RT PCR NEGATIVE NEGATIVE   Influenza A by PCR NEGATIVE NEGATIVE   Influenza B by PCR NEGATIVE NEGATIVE    Comment: (NOTE) The Xpert Xpress SARS-CoV-2/FLU/RSV plus assay is intended as an aid in the diagnosis of influenza from Nasopharyngeal swab specimens and should not be used as a sole basis for treatment. Nasal washings and aspirates are unacceptable for Xpert Xpress SARS-CoV-2/FLU/RSV testing.  Fact Sheet for Patients: EntrepreneurPulse.com.au  Fact Sheet for Healthcare Providers: IncredibleEmployment.be  This test is not yet approved or cleared by the [place] FDA and has been authorized for detection and/or diagnosis of SARS-CoV-2 by FDA under an Emergency Use Authorization (EUA). This EUA will remain in effect (meaning this test can be used) for the duration of the COVID-19 declaration under Section 564(b)(1) of the Act, 21 U.S.C. section 360bbb-3(b)(1), unless the authorization is terminated or revoked.     Resp Syncytial Virus by PCR NEGATIVE NEGATIVE    Comment: (NOTE) Fact Sheet for Patients: EntrepreneurPulse.com.au  Fact Sheet for Healthcare Providers: IncredibleEmployment.be  This test is not yet approved or cleared by the [place] FDA and has been authorized for detection and/or diagnosis of SARS-CoV-2 by FDA under an  Emergency Use Authorization (EUA). This EUA will remain in effect (meaning this test can be used) for the duration of the COVID-19 declaration under Section 564(b)(1) of the Act, 21 U.S.C. section 360bbb-3(b)(1), unless the authorization is terminated or revoked.  Performed at [facility], [place] [address] [place], [place] [address]   Lactic acid, plasma     Status: None   Collection Time: 07/09/22  3:08 AM  Result Value Ref Range   Lactic Acid, Venous 1.5 0.5 - 1.9 mmol/L    Comment: Performed at [facility] [address] [place], [place] [address]  Comprehensive metabolic panel     Status: Abnormal   Collection Time: 07/09/22  3:08 AM  Result Value Ref  Range   Sodium 135 135 - 145 mmol/L   Potassium 4.0 3.5 - 5.1 mmol/L    Comment: HEMOLYSIS AT THIS LEVEL MAY AFFECT RESULT   Chloride 108 98 - 111 mmol/L   CO2 17 (L) 22 - 32 mmol/L   Glucose, Bld 151 (H) 70 - 99 mg/dL    Comment: Glucose reference range applies only to samples taken after fasting for at least 8 hours.   BUN 26 (H) 8 - 23 mg/dL   Creatinine, Ser 1.90 (H) 0.44 - 1.00 mg/dL   Calcium 7.6 (L) 8.9 - 10.3 mg/dL   Total Protein 5.6 (L) 6.5 - 8.1 g/dL   Albumin 3.0 (L) 3.5 - 5.0 g/dL   AST 18 15 - 41 U/L    Comment: HEMOLYSIS AT THIS LEVEL MAY AFFECT RESULT   ALT 10 0 - 44 U/L    Comment: HEMOLYSIS AT THIS LEVEL MAY AFFECT RESULT   Alkaline Phosphatase 56 38 - 126 U/L   Total Bilirubin 0.8 0.3 - 1.2 mg/dL    Comment: HEMOLYSIS AT THIS LEVEL MAY AFFECT RESULT   GFR, Estimated 26 (L) >60 mL/min    Comment: (NOTE) Calculated using the CKD-EPI Creatinine Equation (2021)    Anion gap 10 5 - 15    Comment: Performed at [facility], [place] [address], [place], [place] [address]  CBC with Differential     Status: Abnormal   Collection Time: 07/09/22  3:08 AM  Result Value Ref Range   WBC 9.4 4.0 - 10.5 K/uL   RBC 3.32 (L) 3.87 - 5.11 MIL/uL   Hemoglobin 9.6 (L) 12.0 - 15.0 g/dL   HCT 30.9 (L) 36.0  - 46.0 %   MCV 93.1 80.0 - 100.0 fL   MCH 28.9 26.0 - 34.0 pg   MCHC 31.1 30.0 - 36.0 g/dL   RDW 15.2 11.5 - 15.5 %   Platelets 211 150 - 400 K/uL   nRBC 0.0 0.0 - 0.2 %   Neutrophils Relative % 94 %   Neutro Abs 8.8 (H) 1.7 - 7.7 K/uL   Lymphocytes Relative 3 %   Lymphs Abs 0.3 (L) 0.7 - 4.0 K/uL   Monocytes Relative 3 %   Monocytes Absolute 0.3 0.1 - 1.0 K/uL   Eosinophils Relative 0 %   Eosinophils Absolute 0.0 0.0 - 0.5 K/uL   Basophils Relative 0 %   Basophils Absolute 0.0 0.0 - 0.1 K/uL   Immature Granulocytes 0 %   Abs Immature Granulocytes 0.03 0.00 - 0.07 K/uL    Comment: Performed at [facility], [place] [address] [place], [place] [address]  Protime-INR     Status: None   Collection Time: 07/09/22  3:08 AM  Result Value Ref Range   Prothrombin Time 14.1 11.4 - 15.2 seconds   INR 1.1 0.8 - 1.2    Comment: (NOTE) INR goal varies based on device and disease states. Performed at [facility], [place] [address] [place], [place] [address]   APTT     Status: None   Collection Time: 07/09/22  3:08 AM  Result Value Ref Range   aPTT 27 24 - 36 seconds    Comment: Performed at [facility] [address], [place], [place] [address]  Lactic acid, plasma     Status: Abnormal   Collection Time: 07/09/22  4:41 AM  Result Value Ref Range   Lactic Acid, Venous 2.5 (HH) 0.5 - 1.9 mmol/L  Comment: CRITICAL RESULT CALLED TO, READ BACK BY AND VERIFIED WITH [facility] YOUNG RN 07/09/22 0532 Wiliam Ke Performed at [facility], [place] [address], [place], [place] [address]   TSH     Status: None   Collection Time: 07/09/22  4:41 AM  Result Value Ref Range   TSH 1.617 0.350 - 4.500 uIU/mL    Comment: Performed by a 3rd Generation assay with a functional sensitivity of <=0.01 uIU/mL. Performed at [facility], [place] [address], [place], [place] [address]    DG Chest Port 1 View  Result Date: 07/09/2022 CLINICAL DATA:  Questionable sepsis EXAM: PORTABLE  CHEST 1 VIEW COMPARISON:  11/04/2020 FINDINGS: Heart and mediastinal contours are within normal limits. No focal opacities or effusions. No acute bony abnormality. IMPRESSION: No active disease. Electronically Signed   By: Rolm Baptise M.D.   On: 07/09/2022 03:13    Pending Labs Unresulted Labs (From admission, onward)     Start     Ordered   07/10/22 0500  CBC  Tomorrow morning,   R        07/09/22 0525   07/10/22 4650  Basic metabolic panel  Tomorrow morning,   R        07/09/22 0525   07/09/22 0432  Hemoglobin A1c  Once,   URGENT       Comments: To assess prior glycemic control    07/09/22 0432   07/09/22 0245  Blood Culture (routine x 2)  (Undifferentiated presentation (screening labs and basic nursing orders))  BLOOD CULTURE X 2,   STAT      07/09/22 0246            Vitals/Pain Today's Vitals   07/09/22 0449 07/09/22 0530 07/09/22 0600 07/09/22 0636  BP:  (!) 131/50 (!) 144/61   Pulse:  60 66   Resp:  18 19   Temp:    99 F (37.2 C)  TempSrc:    Oral  SpO2:  96% 98%   PainSc: 0-No pain       Isolation Precautions Airborne and Contact precautions  Medications Medications  lactated ringers infusion ( Intravenous New Bag/Given 07/09/22 0318)  insulin aspart (novoLOG) injection 0-9 Units (has no administration in time range)  enoxaparin (LOVENOX) injection 40 mg (has no administration in time range)  acetaminophen (TYLENOL) tablet 650 mg (has no administration in time range)    Or  acetaminophen (TYLENOL) suppository 650 mg (has no administration in time range)  ondansetron (ZOFRAN) tablet 4 mg (has no administration in time range)    Or  ondansetron (ZOFRAN) injection 4 mg (has no administration in time range)  amiodarone (PACERONE) tablet 200 mg (has no administration in time range)  amLODipine (NORVASC) tablet 5 mg (has no administration in time range)  clopidogrel (PLAVIX) tablet 75 mg (has no administration in time range)  donepezil (ARICEPT) tablet 10 mg (has  no administration in time range)  levothyroxine (SYNTHROID) tablet 100 mcg (has no administration in time range)  erythromycin ophthalmic ointment 1 Application (has no administration in time range)  LORazepam (ATIVAN) tablet 1 mg (has no administration in time range)  QUEtiapine (SEROQUEL) tablet 25 mg (has no administration in time range)  risperiDONE (RISPERDAL) tablet 0.5 mg (has no administration in time range)  insulin glargine-yfgn (SEMGLEE) injection 5 Units (has no administration in time range)  acetaminophen (TYLENOL) tablet 650 mg (650 mg Oral Given 07/09/22 0316)  cefTRIAXone (ROCEPHIN) 2 g in sodium chloride 0.9 % 100  mL IVPB (0 g Intravenous Stopped 07/09/22 0531)    Mobility walks     Focused Assessments Cardiac Assessment Handoff:  Cardiac Rhythm: Normal sinus rhythm No results found for: "CKTOTAL", "CKMB", "CKMBINDEX", "TROPONINI" No results found for: "DDIMER" Does the Patient currently have chest pain? No    R Recommendations: See Admitting Provider Note  Report given to:   Additional Notes: Patient is alert oriented denies chest pain , purewick in place HR -NSR

## 2022-07-09 NOTE — Evaluation (Signed)
Occupational Therapy Evaluation Patient Details Name: Melissa Jackson MRN: [id number] DOB: [birth year] Today's Date: 07/09/2022   History of Present Illness 82 y.o. female adm 2/5 with medical history significant of severe dementia, PAF no longer on AC, HTN, HLD, DM2.  CKD 3 with creat 1.5 as of May 2023.   She was admitted with UTI as well as AKI.   Clinical Impression   Patient admitted for the diagnosis above.  PTA she lives with her spouse, who assists as needed with transfers, ADL/iADL, community mobility and medications.  Per spouse, patient is very close to her baseline with respect to toilet transfers and ADL completion.  No significant OT needs exist in the acute setting, and patient's spouse does not want post acute OT.  Recommend home with prior level of supports when cleared medically.        Recommendations for follow up therapy are one component of a multi-disciplinary discharge planning process, led by the attending physician.  Recommendations may be updated based on patient status, additional functional criteria and insurance authorization.   Follow Up Recommendations  No OT follow up     Assistance Recommended at Discharge Frequent or constant Supervision/Assistance  Patient can return home with the following Assist for transportation;Assistance with cooking/housework;A lot of help with walking and/or transfers;A lot of help with bathing/dressing/bathroom;Direct supervision/assist for medications management;Direct supervision/assist for financial management;Help with stairs or ramp for entrance    Functional Status Assessment  Patient has not had a recent decline in their functional status  Equipment Recommendations  None recommended by OT    Recommendations for Other Services       Precautions / Restrictions Precautions Precautions: Fall Precaution Comments: Arthritic knees and shoulders.  Wears Knee Braces at home for trnasfers. Restrictions Weight Bearing  Restrictions: No      Mobility Bed Mobility Overal bed mobility: Needs Assistance Bed Mobility: Supine to Sit     Supine to sit: Min assist, HOB elevated          Transfers Overall transfer level: Needs assistance   Transfers: Sit to/from Stand, Bed to chair/wheelchair/BSC Sit to Stand: Min assist     Step pivot transfers: Min assist            Balance Overall balance assessment: Needs assistance Sitting-balance support: Feet supported Sitting balance-Leahy Scale: Fair     Standing balance support: Bilateral upper extremity supported Standing balance-Leahy Scale: Poor                             ADL either performed or assessed with clinical judgement   ADL Overall ADL's : At baseline                                             Vision Patient Visual Report: No change from baseline       Perception     Praxis      Pertinent Vitals/Pain Pain Assessment Pain Assessment: Faces Faces Pain Scale: Hurts a little bit Pain Location: shoulders and knees with mobility Pain Descriptors / Indicators: Aching Pain Intervention(s): Monitored during session     Hand Dominance Right   Extremity/Trunk Assessment Upper Extremity Assessment Upper Extremity Assessment: Generalized weakness   Lower Extremity Assessment Lower Extremity Assessment: Defer to PT evaluation   Cervical / Trunk Assessment Cervical / Trunk Assessment: Kyphotic  Communication Communication Communication: No difficulties   Cognition Arousal/Alertness: Awake/alert Behavior During Therapy: WFL for tasks assessed/performed Overall Cognitive Status: History of cognitive impairments - at baseline                                       General Comments   VSS on RA    Exercises     Shoulder Instructions      Home Living Family/patient expects to be discharged to:: Private residence Living Arrangements: Spouse/significant  other Available Help at Discharge: Family;Available 24 hours/day Type of Home: House Home Access: Stairs to enter [employer] of Steps: 1   Home Layout: One level     Bathroom Shower/Tub: Sponge bathes at baseline;Tub/shower unit   Bathroom Toilet: Standard Bathroom Accessibility: Yes How Accessible: Accessible via walker Home Equipment: Evans (2 wheels);Rollator (4 wheels);Transport chair;BSC/3in1          Prior Functioning/Environment Prior Level of Function : Needs assist  Cognitive Assist : ADLs (cognitive)   ADLs (Cognitive): Intermittent cues Physical Assist : ADLs (physical)   ADLs (physical): Bathing;Dressing;Toileting;IADLs Mobility Comments: Spouse assists with transfers to transport chair and 4WRW. ADLs Comments: Spouse assists with all self care, iADL, medications, community mobility        OT Problem List: Decreased strength;Decreased range of motion;Decreased activity tolerance;Impaired balance (sitting and/or standing)      OT Treatment/Interventions:      OT Goals(Current goals can be found in the care plan section) Acute Rehab OT Goals Patient Stated Goal: Return home OT Goal Formulation: With family Time For Goal Achievement: 07/23/22 Potential to Achieve Goals: Good  OT Frequency:      Co-evaluation              AM-PAC OT "6 Clicks" Daily Activity     Outcome Measure Help from another person eating meals?: None Help from another person taking care of personal grooming?: A Little Help from another person toileting, which includes using toliet, bedpan, or urinal?: A Lot Help from another person bathing (including washing, rinsing, drying)?: A Lot Help from another person to put on and taking off regular upper body clothing?: A Little Help from another person to put on and taking off regular lower body clothing?: A Lot 6 Click Score: 16   End of Session Nurse Communication: Mobility status  Activity Tolerance: Patient  tolerated treatment well Patient left: in chair;with call bell/phone within reach;with family/visitor present  OT Visit Diagnosis: Unsteadiness on feet (R26.81);Pain Pain - Right/Left: Right Pain - part of body: Shoulder;Leg                Time: 1530-1550 OT Time Calculation (min): 20 min Charges:  OT General Charges $OT Visit: 1 Visit OT Evaluation $OT Eval Moderate Complexity: 1 Mod  07/09/2022  RP, OTR/L  Acute Rehabilitation Services  Office:  [phone]   Metta Clines 07/09/2022, 4:01 PM

## 2022-07-09 NOTE — H&P (Signed)
History and Physical    Patient: Melissa Jackson RCV:[id number] DOB: 1940/12/25 DOA: 07/09/2022 DOS: the patient was seen and examined on 07/09/2022 PCP: [name], MD  Patient coming from: Home  Chief Complaint:  Chief Complaint  Patient presents with   Chills   HPI: Melissa Jackson is a 82 y.o. female with medical history significant of severe dementia, PAF no longer on AC, HTN, HLD, DM2.  CKD 3 with creat 1.5 as of May 2023.  Pt brought in to ED by husband with onset of chills earlier in afternoon.  Chills were severe, sounds like he is describing rigors.  No cough, vomiting, diarrhea.  Wears a diaper.  Unable to tell if any urinary symptoms.  No known sick contacts.   Review of Systems: As mentioned in the history of present illness. All other systems reviewed and are negative. ROS negative, but pt with severe dementia, oriented to self only, not really able to contribute much to thistory. Past Medical History:  Diagnosis Date   Acute myocardial infarction, subendocardial infarction, initial episode of care ([place])    Anemia    Anticoagulated    ON PRADAXA   Atrial fibrillation ([place])    Coronary artery disease    Diabetes mellitus with no complication (HCC)    Disease of tricuspid valve    MILD   Fall    First degree atrioventricular block    Head ache    High risk medication use    History of total hysterectomy    Hyperlipidemia    SEVERE   Hypertension, essential    Hypothyroidism    Left atrial enlargement    MILD   Mitral valvular disorder    MILD-MODERATE   Old myocardial infarction    JAN 2008   Takotsubo syndrome    Past Surgical History:  Procedure Laterality Date   APPENDECTOMY     COLONOSCOPY  03/28/2005   Moderate left colonic diverticulosis. Internal hemorrhoids.   INCONTINENCE SURGERY     NO PAST SURGERIES     TOTAL ABDOMINAL HYSTERECTOMY     Social History:  reports that she has never smoked. She has never used smokeless tobacco. She  reports that she does not drink alcohol and does not use drugs.  Allergies  Allergen Reactions   Lisinopril     Distant ? cough   Onion Other (See Comments)    Makes pt sick   Codeine Diarrhea   Morphine And Related Other (See Comments)    GI UPSET   Statins Other (See Comments)    ARTHRALGIA    Family History  Problem Relation Age of Onset   Heart attack Father    Alzheimer's disease Mother    Diabetes Mellitus I Mother    Colon cancer Neg Hx     Prior to Admission medications   Medication Sig Start Date End Date Taking? Authorizing Provider  alendronate (FOSAMAX) 70 MG tablet Take 70 mg by mouth once a week. Takes on fridays 11/05/14   [provider]  amiodarone (PACERONE) 200 MG tablet Take 200 mg by mouth daily. 11/22/14   [provider]  amLODipine (NORVASC) 5 MG tablet Take 5 mg by mouth daily. 10/29/14   [provider]  chlorthalidone (HYGROTON) 25 MG tablet Take 25 mg by mouth every morning. 11/09/14   [provider]  clopidogrel (PLAVIX) 75 MG tablet Take 75 mg by mouth daily.    [provider]  ferrous sulfate 325 (65 FE) MG tablet Take  325 mg by mouth 3 (three) times daily with meals.    [provider]  levothyroxine (SYNTHROID) 100 MCG tablet Take 100 mcg by mouth daily. 04/18/21   [provider]  levothyroxine (SYNTHROID) 112 MCG tablet Take 1 tablet (112 mcg total) by mouth daily before breakfast. 03/20/19   Mariel Aloe, MD  losartan (COZAAR) 25 MG tablet Take 1 tablet (25 mg total) by mouth daily. 02/17/21   Josue Hector, MD  metFORMIN (GLUCOPHAGE) 1000 MG tablet Take 1,000 mg by mouth 2 (two) times daily. Patient not taking: Reported on 10/19/2021 03/13/19   [provider]  metoprolol tartrate (LOPRESSOR) 25 MG tablet Take 0.5 tablets (12.5 mg total) by mouth 2 (two) times daily. 03/19/19   Mariel Aloe, MD  Multiple Vitamins-Minerals (MULTIVITAMIN WITH MINERALS) tablet Take 1 tablet  by mouth daily.    [provider]  nitroGLYCERIN (NITROSTAT) 0.4 MG SL tablet Place 0.4 mg under the tongue every 5 (five) minutes as needed for chest pain.    [provider]  Omega 3 1000 MG CAPS Take 2 capsules by mouth 2 (two) times daily.    [provider]  pravastatin (PRAVACHOL) 20 MG tablet as directed. 09/07/17   [provider]  Red Yeast Rice 600 MG CAPS Take 1 capsule by mouth daily.     [provider]    Physical Exam: Vitals:   07/09/22 0300 07/09/22 0330 07/09/22 0430 07/09/22 0445  BP: (!) 124/52 (!) 131/55 (!) 131/54 (!) 119/54  Pulse: 63 62 64 (!) 57  Resp: '18 19 13 19  '$ Temp:      TempSrc:      SpO2: 98% 98% 98% 97%   Constitutional: NAD, calm Respiratory: clear to auscultation bilaterally, no wheezing, no crackles. Normal respiratory effort. No accessory muscle use.  Cardiovascular: Regular rate and rhythm, no murmurs / rubs / gallops. No extremity edema. 2+ pedal pulses. No carotid bruits.  Abdomen: no tenderness, no masses palpated. No hepatosplenomegaly. Bowel sounds positive.  Neurologic: CN 2-12 grossly intact. Sensation intact, DTR normal. Strength 5/5 in all 4.  Psychiatric: Oriented to self only  Data Reviewed:    Urinalysis    Component Value Date/Time   COLORURINE YELLOW 07/09/2022 0253   APPEARANCEUR HAZY (A) 07/09/2022 0253   LABSPEC 1.008 07/09/2022 0253   PHURINE 6.0 07/09/2022 0253   GLUCOSEU 50 (A) 07/09/2022 0253   HGBUR MODERATE (A) 07/09/2022 0253   BILIRUBINUR NEGATIVE 07/09/2022 0253   KETONESUR NEGATIVE 07/09/2022 0253   PROTEINUR 30 (A) 07/09/2022 0253   NITRITE NEGATIVE 07/09/2022 0253   LEUKOCYTESUR MODERATE (A) 07/09/2022 0253    COVID, FLU, RSV = negative     Latest Ref Rng & Units 07/09/2022    3:08 AM 03/19/2019    4:23 AM 03/18/2019   11:55 AM  CBC  WBC 4.0 - 10.5 K/uL 9.4  5.2  5.1   Hemoglobin 12.0 - 15.0 g/dL 9.6  11.6  13.3   Hematocrit 36.0 - 46.0 % 30.9  35.7  40.4    Platelets 150 - 400 K/uL 211  189  206       Latest Ref Rng & Units 07/09/2022    3:08 AM 03/19/2019    4:23 AM 03/18/2019   11:55 AM  CMP  Glucose 70 - 99 mg/dL 151  159  90   BUN 8 - 23 mg/dL '26  11  12   '$ Creatinine 0.44 - 1.00 mg/dL 1.90  1.11  1.18   Sodium 135 - 145 mmol/L 135  138  140   Potassium 3.5 - 5.1 mmol/L 4.0  3.4  3.6   Chloride 98 - 111 mmol/L 108  105  104   CO2 22 - 32 mmol/L '17  22  24   '$ Calcium 8.9 - 10.3 mg/dL 7.6  7.0  7.4   Total Protein 6.5 - 8.1 g/dL 5.6   6.7   Total Bilirubin 0.3 - 1.2 mg/dL 0.8   0.5   Alkaline Phos 38 - 126 U/L 56   42   AST 15 - 41 U/L 18   22   ALT 0 - 44 U/L 10   11    Lactate 1.5  CXR negative  Assessment and Plan: * UTI (urinary tract infection) Pt with UTI based on UA + fever to 101.8. Empiric rocephin UCx pending BCx pending Tylenol PRN fever  AKI (acute kidney injury) ([place]) Pt with mild AKI today as well.  Creat of 1.9 is up from 1.5 baseline with PCP in May of 2023. IVF Repeat BMP in AM  Dementia ([place]) Seems to have very advanced dementia at baseline.  Can't really tell if she is any more confused than baseline today given how advanced dementia is per husband.  AAO to self only but seems that this is baseline per husband..   Hypothyroidism Cont home synthroid once med rec completed. Will check TSH as well.  Hypertension, essential Continue home BP meds  Diabetes mellitus with no complication (HCC) Cont low dose degludec. Sensitive SSI AC.  Atrial fibrillation (HCC) Cont amiodarone Cont BB Elderly with dementia, long term NSR as far as we are aware so no AC as per Cards office note.      Advance Care Planning:   Code Status: Full Code Still full code at this time per husband  Consults: None  Family Communication: Husband at bedside  Severity of Illness: The appropriate patient status for this patient is OBSERVATION. Observation status is judged to be reasonable and necessary in order to provide  the required intensity of service to ensure the patient's safety. The patient's presenting symptoms, physical exam findings, and initial radiographic and laboratory data in the context of their medical condition is felt to place them at decreased risk for further clinical deterioration. Furthermore, it is anticipated that the patient will be medically stable for discharge from the hospital within 2 midnights of admission.   Author: Etta Quill., DO 07/09/2022 4:52 AM  For on call review [email or web address].

## 2022-07-09 NOTE — Assessment & Plan Note (Addendum)
Continue home BP meds

## 2022-07-09 NOTE — Progress Notes (Signed)
Melissa Jackson is a 82 y.o. female with medical history significant of severe dementia, PAF no longer on AC, HTN, HLD, DM2.  CKD 3 with creat 1.5 as of May 2023.   Pt brought in to ED by husband with onset of chills earlier in afternoon.  Chills were severe, sounds like he is describing rigors.  No cough, vomiting, diarrhea.  Wears a diaper.  Unable to tell if any urinary symptoms.  No known sick contacts.  Patient was admitted after midnight and seen and evaluated at bedside. She was admitted with UTI as well as AKI and has been started on Rocephin and IVF respectively.  Husband is currently at bedside and states that she appears to be doing better this morning.  Tmax of 101.8 Fahrenheit overnight with blood cultures and urine cultures pending.  A.m. labs ordered.  Total care time: 20 minutes.

## 2022-07-09 NOTE — Assessment & Plan Note (Addendum)
Pt with mild AKI today as well.  Creat of 1.9 is up from 1.5 baseline with PCP in May of 2023. IVF Repeat BMP in AM

## 2022-07-09 NOTE — ED Provider Notes (Signed)
[facility] Provider Note   CSN: [id number] Arrival date & time: 07/09/22  0222     History  Chief Complaint  Patient presents with   Chills    Melissa Jackson is a 82 y.o. female.  The history is provided by the spouse. The history is limited by the condition of the patient (Dementia).  She has history of hypertension, diabetes, hyperlipidemia, atrial fibrillation not on anticoagulants and is brought in by her husband because she had chills starting this afternoon.  She has not had any cough, vomiting, diarrhea.  She wears a diaper, unable to ascertain whether there have been any urinary symptoms.  There have been no known sick contacts.   Home Medications Prior to Admission medications   Medication Sig Start Date End Date Taking? Authorizing Provider  alendronate (FOSAMAX) 70 MG tablet Take 70 mg by mouth once a week. Takes on fridays 11/05/14   [provider]  amiodarone (PACERONE) 200 MG tablet Take 200 mg by mouth daily. 11/22/14   [provider]  amLODipine (NORVASC) 5 MG tablet Take 5 mg by mouth daily. 10/29/14   [provider]  chlorthalidone (HYGROTON) 25 MG tablet Take 25 mg by mouth every morning. 11/09/14   [provider]  clopidogrel (PLAVIX) 75 MG tablet Take 75 mg by mouth daily.    [provider]  ferrous sulfate 325 (65 FE) MG tablet Take 325 mg by mouth 3 (three) times daily with meals.    [provider]  levothyroxine (SYNTHROID) 100 MCG tablet Take 100 mcg by mouth daily. 04/18/21   [provider]  levothyroxine (SYNTHROID) 112 MCG tablet Take 1 tablet (112 mcg total) by mouth daily before breakfast. 03/20/19   Mariel Aloe, MD  losartan (COZAAR) 25 MG tablet Take 1 tablet (25 mg total) by mouth daily. 02/17/21   Josue Hector, MD  metFORMIN (GLUCOPHAGE) 1000 MG tablet Take 1,000 mg by mouth 2 (two) times daily. Patient not taking: Reported on 10/19/2021  03/13/19   [provider]  metoprolol tartrate (LOPRESSOR) 25 MG tablet Take 0.5 tablets (12.5 mg total) by mouth 2 (two) times daily. 03/19/19   Mariel Aloe, MD  Multiple Vitamins-Minerals (MULTIVITAMIN WITH MINERALS) tablet Take 1 tablet by mouth daily.    [provider]  nitroGLYCERIN (NITROSTAT) 0.4 MG SL tablet Place 0.4 mg under the tongue every 5 (five) minutes as needed for chest pain.    [provider]  Omega 3 1000 MG CAPS Take 2 capsules by mouth 2 (two) times daily.    [provider]  pravastatin (PRAVACHOL) 20 MG tablet as directed. 09/07/17   [provider]  Red Yeast Rice 600 MG CAPS Take 1 capsule by mouth daily.     [provider]      Allergies    Lisinopril, Onion, Codeine, Morphine and related, and Statins    Review of Systems   Review of Systems  Unable to perform ROS: Dementia    Physical Exam Updated Vital Signs BP (!) 119/54   Pulse (!) 57   Temp (!) 101.8 F (38.8 C) (Rectal)   Resp 19   LMP  (LMP Unknown)   SpO2 97%  Physical Exam Vitals and nursing note reviewed.  82 year old female, resting comfortably and in no acute distress. Vital signs are significant for elevated temperature and slightly slow heart rate. Oxygen saturation is 97%, which is normal. Head is normocephalic and  atraumatic. PERRLA, EOMI. Oropharynx is clear. Neck is nontender and supple without adenopathy or JVD. Back is nontender and there is no CVA tenderness. Lungs are clear without rales, wheezes, or rhonchi. Chest is nontender. Heart has regular rate and rhythm without murmur. Abdomen is soft, flat, nontender. Extremities have no cyanosis or edema, full range of motion is present. Skin is warm and dry without rash. Neurologic: Awake and alert, oriented to person but not place or time, cranial nerves are intact, moves all extremities equally.  ED Results / Procedures / Treatments   Labs (all labs ordered are listed,  but only abnormal results are displayed) Labs Reviewed  COMPREHENSIVE METABOLIC PANEL - Abnormal; Notable for the following components:      Result Value   CO2 17 (*)    Glucose, Bld 151 (*)    BUN 26 (*)    Creatinine, Ser 1.90 (*)    Calcium 7.6 (*)    Total Protein 5.6 (*)    Albumin 3.0 (*)    GFR, Estimated 26 (*)    All other components within normal limits  CBC WITH DIFFERENTIAL/PLATELET - Abnormal; Notable for the following components:   RBC 3.32 (*)    Hemoglobin 9.6 (*)    HCT 30.9 (*)    Neutro Abs 8.8 (*)    Lymphs Abs 0.3 (*)    All other components within normal limits  URINALYSIS, W/ REFLEX TO CULTURE (INFECTION SUSPECTED) - Abnormal; Notable for the following components:   APPearance HAZY (*)    Glucose, UA 50 (*)    Hgb urine dipstick MODERATE (*)    Protein, ur 30 (*)    Leukocytes,Ua MODERATE (*)    Bacteria, UA FEW (*)    All other components within normal limits  RESP PANEL BY RT-PCR (RSV, FLU A&B, COVID)  RVPGX2  CULTURE, BLOOD (ROUTINE X 2)  CULTURE, BLOOD (ROUTINE X 2)  URINE CULTURE  LACTIC ACID, PLASMA  PROTIME-INR  APTT  LACTIC ACID, PLASMA  HEMOGLOBIN A1C  TSH    EKG EKG Interpretation  Date/Time:  Monday July 09 2022 02:43:42 EST Ventricular Rate:  68 PR Interval:  186 QRS Duration: 96 QT Interval:  400 QTC Calculation: 426 R Axis:   60 Text Interpretation: Sinus or ectopic atrial rhythm Probable anterior infarct, age indeterminate Lateral leads are also involved Confirmed by Delora Fuel ([id number]) on 07/09/2022 2:46:34 AM  Radiology DG Chest Port 1 View  Result Date: 07/09/2022 CLINICAL DATA:  Questionable sepsis EXAM: PORTABLE CHEST 1 VIEW COMPARISON:  11/04/2020 FINDINGS: Heart and mediastinal contours are within normal limits. No focal opacities or effusions. No acute bony abnormality. IMPRESSION: No active disease. Electronically Signed   By: Rolm Baptise M.D.   On: 07/09/2022 03:13    Procedures Procedures  Cardiac monitor  shows normal sinus rhythm, per my interpretation.  Medications Ordered in ED Medications  lactated ringers infusion ( Intravenous New Bag/Given 07/09/22 0318)  cefTRIAXone (ROCEPHIN) 2 g in sodium chloride 0.9 % 100 mL IVPB (2 g Intravenous New Bag/Given 07/09/22 0448)  insulin aspart (novoLOG) injection 0-9 Units (has no administration in time range)  acetaminophen (TYLENOL) tablet 650 mg (650 mg Oral Given 07/09/22 0316)    ED Course/ Medical Decision Making/ A&P                             Medical Decision Making Amount and/or Complexity of Data Reviewed Labs: ordered. Radiology: ordered.  Risk  OTC drugs. Prescription drug management.   Fever and chills with concern for sepsis.  I have ordered the patient to start on the evolving sepsis pathway.  I have ordered acetaminophen for fever.  Chest x-ray shows no evidence of pneumonia.  I have independently viewed the image, and agree with radiologist's interpretation.  I have reviewed and interpreted her electrocardiogram, and my interpretation is no acute ST or T changes.  I have reviewed and interpreted her laboratory tests, and my interpretation is metabolic acidosis with normal anion gap, renal insufficiency, normochromic normocytic anemia, normal WBC but with left shift, normal lactic acid level.  Respiratory pathogen panel is negative for influenza, COVID-19, RSV.  Urinalysis is strongly suggestive of UTI with greater than 50 WBCs although nitrite is negative.  I have ordered antibiotics for UTI-ceftriaxone.  I am concerned given her severe chills at home that she is likely bacteremic even though she does not meet criteria for sepsis currently, so I feel she should be admitted to the hospital at least for initial management.  I have discussed case with Dr. Alcario Drought of [facility], who agrees to admit the patient.  I have reviewed her old records, and last metabolic panel on record was from 2014 at which time she had normal BUN and  creatinine and normal CO2.  CRITICAL CARE Performed by: Delora Fuel Total critical care time: 55 minutes Critical care time was exclusive of separately billable procedures and treating other patients. Critical care was necessary to treat or prevent imminent or life-threatening deterioration. Critical care was time spent personally by me on the following activities: development of treatment plan with patient and/or surrogate as well as nursing, discussions with consultants, evaluation of patient's response to treatment, examination of patient, obtaining history from patient or surrogate, ordering and performing treatments and interventions, ordering and review of laboratory studies, ordering and review of radiographic studies, pulse oximetry and re-evaluation of patient's condition.  Final Clinical Impression(s) / ED Diagnoses Final diagnoses:  Urinary tract infection without hematuria, site unspecified  Renal insufficiency  Metabolic acidosis  Normochromic normocytic anemia    Rx / DC Orders ED Discharge Orders     None         Delora Fuel, MD 03/55/97 [phone]

## 2022-07-10 DIAGNOSIS — Z515 Encounter for palliative care: Secondary | ICD-10-CM

## 2022-07-10 DIAGNOSIS — N289 Disorder of kidney and ureter, unspecified: Secondary | ICD-10-CM

## 2022-07-10 DIAGNOSIS — Z1152 Encounter for screening for COVID-19: Secondary | ICD-10-CM | POA: Diagnosis not present

## 2022-07-10 DIAGNOSIS — E872 Acidosis, unspecified: Secondary | ICD-10-CM | POA: Diagnosis not present

## 2022-07-10 DIAGNOSIS — I252 Old myocardial infarction: Secondary | ICD-10-CM | POA: Diagnosis not present

## 2022-07-10 DIAGNOSIS — E785 Hyperlipidemia, unspecified: Secondary | ICD-10-CM | POA: Diagnosis present

## 2022-07-10 DIAGNOSIS — N3000 Acute cystitis without hematuria: Secondary | ICD-10-CM | POA: Diagnosis not present

## 2022-07-10 DIAGNOSIS — F03C11 Unspecified dementia, severe, with agitation: Secondary | ICD-10-CM | POA: Diagnosis present

## 2022-07-10 DIAGNOSIS — D649 Anemia, unspecified: Secondary | ICD-10-CM

## 2022-07-10 DIAGNOSIS — N179 Acute kidney failure, unspecified: Secondary | ICD-10-CM | POA: Diagnosis present

## 2022-07-10 DIAGNOSIS — Z90710 Acquired absence of both cervix and uterus: Secondary | ICD-10-CM | POA: Diagnosis not present

## 2022-07-10 DIAGNOSIS — Z789 Other specified health status: Secondary | ICD-10-CM | POA: Diagnosis not present

## 2022-07-10 DIAGNOSIS — Z66 Do not resuscitate: Secondary | ICD-10-CM

## 2022-07-10 DIAGNOSIS — R159 Full incontinence of feces: Secondary | ICD-10-CM | POA: Diagnosis present

## 2022-07-10 DIAGNOSIS — Z7189 Other specified counseling: Secondary | ICD-10-CM

## 2022-07-10 DIAGNOSIS — E039 Hypothyroidism, unspecified: Secondary | ICD-10-CM | POA: Diagnosis present

## 2022-07-10 DIAGNOSIS — I480 Paroxysmal atrial fibrillation: Secondary | ICD-10-CM | POA: Diagnosis present

## 2022-07-10 DIAGNOSIS — D631 Anemia in chronic kidney disease: Secondary | ICD-10-CM | POA: Diagnosis present

## 2022-07-10 DIAGNOSIS — N390 Urinary tract infection, site not specified: Secondary | ICD-10-CM | POA: Diagnosis present

## 2022-07-10 DIAGNOSIS — I129 Hypertensive chronic kidney disease with stage 1 through stage 4 chronic kidney disease, or unspecified chronic kidney disease: Secondary | ICD-10-CM | POA: Diagnosis present

## 2022-07-10 DIAGNOSIS — Z781 Physical restraint status: Secondary | ICD-10-CM | POA: Diagnosis not present

## 2022-07-10 DIAGNOSIS — B9620 Unspecified Escherichia coli [E. coli] as the cause of diseases classified elsewhere: Secondary | ICD-10-CM | POA: Diagnosis present

## 2022-07-10 DIAGNOSIS — Z8249 Family history of ischemic heart disease and other diseases of the circulatory system: Secondary | ICD-10-CM | POA: Diagnosis not present

## 2022-07-10 DIAGNOSIS — Z885 Allergy status to narcotic agent status: Secondary | ICD-10-CM | POA: Diagnosis not present

## 2022-07-10 DIAGNOSIS — I440 Atrioventricular block, first degree: Secondary | ICD-10-CM | POA: Diagnosis present

## 2022-07-10 DIAGNOSIS — Z820 Family history of epilepsy and other diseases of the nervous system: Secondary | ICD-10-CM | POA: Diagnosis not present

## 2022-07-10 DIAGNOSIS — R6883 Chills (without fever): Secondary | ICD-10-CM | POA: Diagnosis present

## 2022-07-10 DIAGNOSIS — Z833 Family history of diabetes mellitus: Secondary | ICD-10-CM | POA: Diagnosis not present

## 2022-07-10 DIAGNOSIS — I2510 Atherosclerotic heart disease of native coronary artery without angina pectoris: Secondary | ICD-10-CM | POA: Diagnosis present

## 2022-07-10 DIAGNOSIS — E1122 Type 2 diabetes mellitus with diabetic chronic kidney disease: Secondary | ICD-10-CM | POA: Diagnosis present

## 2022-07-10 DIAGNOSIS — N184 Chronic kidney disease, stage 4 (severe): Secondary | ICD-10-CM | POA: Diagnosis present

## 2022-07-10 LAB — CBC
HCT: 35.8 % — ABNORMAL LOW (ref 36.0–46.0)
Hemoglobin: 11.3 g/dL — ABNORMAL LOW (ref 12.0–15.0)
MCH: 28.8 pg (ref 26.0–34.0)
MCHC: 31.6 g/dL (ref 30.0–36.0)
MCV: 91.3 fL (ref 80.0–100.0)
Platelets: 229 10*3/uL (ref 150–400)
RBC: 3.92 MIL/uL (ref 3.87–5.11)
RDW: 15.2 % (ref 11.5–15.5)
WBC: 9 10*3/uL (ref 4.0–10.5)
nRBC: 0 % (ref 0.0–0.2)

## 2022-07-10 LAB — BASIC METABOLIC PANEL
Anion gap: 12 (ref 5–15)
BUN: 26 mg/dL — ABNORMAL HIGH (ref 8–23)
CO2: 18 mmol/L — ABNORMAL LOW (ref 22–32)
Calcium: 8.8 mg/dL — ABNORMAL LOW (ref 8.9–10.3)
Chloride: 108 mmol/L (ref 98–111)
Creatinine, Ser: 1.79 mg/dL — ABNORMAL HIGH (ref 0.44–1.00)
GFR, Estimated: 28 mL/min — ABNORMAL LOW (ref >60)
Glucose, Bld: 194 mg/dL — ABNORMAL HIGH (ref 70–99)
Potassium: 3.7 mmol/L (ref 3.5–5.1)
Sodium: 138 mmol/L (ref 135–145)

## 2022-07-10 LAB — MAGNESIUM: Magnesium: 2 mg/dL (ref 1.7–2.4)

## 2022-07-10 LAB — LACTIC ACID, PLASMA: Lactic Acid, Venous: 1.1 mmol/L (ref 0.5–1.9)

## 2022-07-10 LAB — GLUCOSE, CAPILLARY
Glucose-Capillary: 199 mg/dL — ABNORMAL HIGH (ref 70–99)
Glucose-Capillary: 90 mg/dL (ref 70–99)
Glucose-Capillary: 122 mg/dL — ABNORMAL HIGH (ref 70–99)
Glucose-Capillary: 98 mg/dL (ref 70–99)

## 2022-07-10 MED ORDER — IBUPROFEN 400 MG PO TABS
200.0000 mg | ORAL_TABLET | Freq: Once | ORAL | Status: AC
  Administered 2022-07-10: 200 mg via ORAL
  Filled 2022-07-10: qty 1

## 2022-07-10 MED ORDER — SODIUM CHLORIDE 0.9 % IV SOLN: INTRAVENOUS | Status: DC

## 2022-07-10 MED ORDER — QUETIAPINE FUMARATE 50 MG PO TABS
50.0000 mg | ORAL_TABLET | Freq: Every day | ORAL | Status: DC
  Administered 2022-07-10: 50 mg via ORAL
  Filled 2022-07-10: qty 1

## 2022-07-10 MED ORDER — DIAZEPAM 5 MG/ML IJ SOLN
5.0000 mg | Freq: Once | INTRAMUSCULAR | Status: DC
  Filled 2022-07-10: qty 2

## 2022-07-10 NOTE — Hospital Course (Addendum)
82 y.o. female with medical history significant of severe dementia, PAF no longer on AC, HTN, HLD, DM2.  CKD 3 with creat 1.5 as of May 2023 brought to the ED with sudden onset of chills but no cough vomiting diarrhea with baseline dementia confusion. Seen in the ED, labs with creatinine 1.9 hemoglobin 9.6 g lactic acidosis up to 2.5 subsequently resolved 1.1 TSH 1.6, chest x-ray no acute finding.  ED-more than 50 bacteria few leukocyte moderate COVID influenza negative> felt to have AKI/UTI and admitted for further management.  Patient needed restraint due to agitation on 2/5 night> Seroquel dose up at and rested well next day.  Placed on IV fluids due to elevated creatinine.  Palliative care consultation obtained and  changed to DNR.  Able to obtain labs from her outpatient PCP office her creatinine on 04/26/22 was 1.88.  Now slightly up suspect close to her baseline since mentation has improved with antibiotics and patient is tolerating diet, she will do much better at home with family and family environment close by, we will discharge her home, husband is agreeable and appreciative of discharge today.

## 2022-07-10 NOTE — Consult Note (Signed)
Consultation Note Date: 07/10/2022   Patient Name: Melissa Jackson  DOB: [birth year]  MRN: [id number]  Age / Sex: 82 y.o., female  PCP: [name], MD Referring Physician: Antonieta Pert, MD  Reason for Consultation: Establishing goals of care, "dementia advanced, GOC CODE status"  HPI/Patient Profile: 82 y.o. female  with past medical history of severe dementia, PAF no longer on AC, HTN, HLD, DM2. CKD 3 presented to ED on 07/09/22 from home with husbands concerns of her having chills. Patient was admitted on 07/09/2022 with UTI and AKI.   Clinical Assessment and Goals of Care: I have reviewed medical records including EPIC notes, labs, and imaging. Received report from primary RN - no acute concerns, patient calm today. Reviewed MAR and discussed medication administration plan for patient this evening with RN - aricept, risperdal, and seroquel now scheduled for 2000. Can give earlier, if needed, for proactive approach to sundowning compared to reactive. Ativan is also available PRN at bedtime.  Went to visit patient at bedside - husband/Roger and son/Phillip present. Patient was lying in bed asleep - I did not attempt to wake her to preserve comfort. No signs or non-verbal gestures of pain or discomfort noted. No respiratory distress, increased work of breathing, or secretions noted. She is weak and frail appearing.  Met with Francee Piccolo and Doren Custard  to discuss diagnosis, prognosis, GOC, EOL wishes, disposition, and options.  I introduced Palliative Medicine as specialized medical care for people living with serious illness. It focuses on providing relief from the symptoms and stress of a serious illness. The goal is to improve quality of life for both the patient and the family.  We discussed a brief life review of the patient as well as functional and nutritional status. Patient and Francee Piccolo have been married for 30 years - she  has 6 children (5 sons and 1 daughter). Prior to hospitalization, she was living in a private residence with her husband, whom is her primary caregiver. At home, patient was able to ambulate small distances within her home with a walker plus assistance from her husband. She is not able to dress or bathe herself. Per husband, she has issues with her balance. They do not have [facility] services/aid assistance. Patient is reported to have a good appetite - albumin noted at 3.0 on 07/09/22. Roger noted patient's decline on Sunday when she began having severe chills. Patient does not have any issues swallowing, is rarely incontinent of bowel/bladder, and can communicate when asked simple questions. She cannot make complex medical decisions.  Another son/Tim arrived to room. Patient was intermittently awake/asleep for rest of visit.  We discussed patient's current illness and what it means in the larger context of patient's on-going co-morbidities. Family understand that dementia and CKD are progressive, non-curable disease underlying the patient's current acute medical conditions. Education provided on AKI in context of CKD per family's request and interventions reviewed. We reviewed specific indicators of end stage dementia, including inability to communicate, bed bound/non-ambulatory status, decreased oral intake, swallowing dysfunction, and incontinence of bowel/bladder. Patient has not reached end stages of illness; however, reviewed with family that dementia would be considered advanced at this time. Reviewed that acute illness can also cause faster progression of chronic illnesses. We did discuss that with the patient's chronic, progressive diseases, we as the medical team can see a time coming down the road where the patient's diseases will reach end stages and comfort care would be appropriate at that time; discussed how it is important to  prepare for that time.  Natural disease trajectory and expectations at EOL were  discussed. I attempted to elicit values and goals of care important to the patient. The difference between aggressive medical intervention and comfort care was considered in light of the patient's goals of care. Currently, husband would want patient rehospitalized in the event of a future decline.  Palliative Care services outpatient were explained and offered - Francee Piccolo was agreeable and appreciative.   Advanced care planning completed - reviewed caregiver support options if Francee Piccolo ever needed additional assistance. He understands it is anticipated patient will become weaker and needed additional support over time.  Discussed option of HH therapies for patient at home. Roger recognizes that patient is not able to meaningfully participate and, in addition to her joint pain, does not feel HH therapies will be beneficial for her. He is not interested in any at this time.  Advance directives, concepts specific to code status, and rehospitalization were considered and discussed. Family understand that patient is at high risk for recurrent hospitalizations. She does have a HCPOA that names her husband as Air traffic controller - requested copy if he is able to provide. Francee Piccolo would want patient rehospitalized in the event of a future decline, at least for medical evaluation. He would make stepwise decisions pending information.   Encouraged family to consider DNR/DNI status understanding evidenced based poor outcomes in similar hospitalized patient, as the cause of arrest is likely associated with advanced chronic/terminal illness rather than an easily reversible acute cardio-pulmonary event.  I shared that even if we pursued resuscitation we would not able to resolve the underlying factors. I explained that DNR/DNI does not change the medical plan and it only comes into effect after a person has arrested (died).  It is a protective measure to keep [place] from harming the patient in their last moments of life. Francee Piccolo  tells me "it's in our Will we do not want life support." Francee Piccolo was agreeable to DNR/DNI with understanding that patient would not receive CPR, defibrillation, ACLS medications, or intubation.   Roger and family have done an excellent job in providing care for patient at home. Francee Piccolo is clear he "wants her to stay at home as long as she can."  Discussed with patient/family the importance of continued conversation with each other and the medical providers regarding overall plan of care and treatment options, ensuring decisions are within the context of the patient's values and GOCs.    Questions and concerns were addressed. The patient/family was encouraged to call with questions and/or concerns. PMT card was provided.   Primary Decision Maker: NEXT OF KIN/HCPOA/husband - Huey Romans - requested copy of HCPOA    SUMMARY OF RECOMMENDATIONS   Continue current gentle medical treatment Now DNR/DNI - durable DNR form completed and placed in shadow chart. Copy was made and will be scanned into [facility]/ACP tab Goal is for patient to return home with outpatient Palliative Care to follow Husband is not interested in [facility] therapies for patient Husband would want patient rehospitalized in the event of a future decline, at least for medical evaluation. He would make stepwise decisions pending information TOC consulted for: outpatient Palliative Care referral Obtain copy of HCPOA if able PMT will continue to follow peripherally. If there are any imminent needs please call the service directly   Code Status/Advance Care Planning: DNR  Palliative Prophylaxis:  Aspiration, Delirium Protocol, Frequent Pain Assessment, Oral Care, and Turn Reposition  Additional Recommendations (Limitations, Scope, Preferences): Full Scope Treatment  and No Tracheostomy  Psycho-social/Spiritual:  Desire for further Chaplaincy support:no Created space and opportunity for patient and family to express thoughts and feelings  regarding patient's current medical situation.  Emotional support and therapeutic listening provided.  Prognosis:  Unable to determine  Discharge Planning: Home with Palliative Services      Primary Diagnoses: Present on Admission:  Hypertension, essential  Atrial fibrillation ([place])  Hypothyroidism  UTI (urinary tract infection)  Dementia ([place])  AKI (acute kidney injury) ([place])   I have reviewed the medical record, interviewed the patient and family, and examined the patient. The following aspects are pertinent.  Past Medical History:  Diagnosis Date   Acute myocardial infarction, subendocardial infarction, initial episode of care ([place])    Anemia    Anticoagulated    ON PRADAXA   Atrial fibrillation ([facility])    Coronary artery disease    Diabetes mellitus with no complication ([facility])    Disease of tricuspid valve    MILD   Fall    First degree atrioventricular block    Head ache    High risk medication use    History of total hysterectomy    Hyperlipidemia    SEVERE   Hypertension, essential    Hypothyroidism    Left atrial enlargement    MILD   Mitral valvular disorder    MILD-MODERATE   Old myocardial infarction    JAN 2008   Takotsubo syndrome    Social History   Socioeconomic History   Marital status: Married    Spouse name: roger   Number of children: 6   Years of education: 11   Highest education level: Not on file  Occupational History   Occupation: retired  Tobacco Use   Smoking status: Never   Smokeless tobacco: Never  Vaping Use   Vaping Use: Never used  Substance and Sexual Activity   Alcohol use: No    Alcohol/week: 0.0 standard drinks of alcohol   Drug use: No   Sexual activity: Not on file  Other Topics Concern   Not on file  Social History Narrative   Not on file   Social Determinants of Health   Financial Resource Strain: Not on file  Food Insecurity: No Food Insecurity (07/09/2022)   Hunger Vital Sign    Worried About Running  Out of Food in the Last Year: Never true    Ran Out of Food in the Last Year: Never true  Transportation Needs: No Transportation Needs (07/09/2022)   PRAPARE - Hydrologist (Medical): No    Lack of Transportation (Non-Medical): No  Physical Activity: Not on file  Stress: Not on file  Social Connections: Not on file   Family History  Problem Relation Age of Onset   Heart attack Father    Alzheimer's disease Mother    Diabetes Mellitus I Mother    Colon cancer Neg Hx    Scheduled Meds:  amiodarone  200 mg Oral Daily   amLODipine  5 mg Oral Daily   clopidogrel  75 mg Oral Daily   diazepam  5 mg Intravenous Once   donepezil  10 mg Oral QHS   enoxaparin (LOVENOX) injection  30 mg Subcutaneous Q24H   erythromycin  1 Application Both Eyes QID   insulin aspart  0-9 Units Subcutaneous TID WC   insulin glargine-yfgn  5 Units Subcutaneous QPM   levothyroxine  100 mcg Oral Q0600   mouth rinse  15 mL Mouth Rinse 4 times  per day   QUEtiapine  50 mg Oral QHS   risperiDONE  0.5 mg Oral BID   Continuous Infusions:  sodium chloride     cefTRIAXone (ROCEPHIN)  IV 2 g (07/10/22 0501)   PRN Meds:.acetaminophen **OR** acetaminophen, haloperidol lactate, LORazepam, ondansetron **OR** ondansetron (ZOFRAN) IV, mouth rinse Medications Prior to Admission:  Prior to Admission medications   Medication Sig Start Date End Date Taking? Authorizing Provider  alendronate (FOSAMAX) 70 MG tablet Take 70 mg by mouth once a week. Takes on Friday 11/05/14  Yes [provider]  amiodarone (PACERONE) 200 MG tablet Take 200 mg by mouth daily. 11/22/14  Yes [provider]  amLODipine (NORVASC) 5 MG tablet Take 5 mg by mouth daily. 10/29/14  Yes [provider]  clopidogrel (PLAVIX) 75 MG tablet Take 75 mg by mouth daily.   Yes [provider]  donepezil (ARICEPT) 10 MG tablet Take 10 mg by mouth at bedtime.   Yes [provider]  erythromycin  ophthalmic ointment Place 1 Application into both eyes 4 (four) times daily. 07/04/22  Yes [provider]  levothyroxine (SYNTHROID) 100 MCG tablet Take 100 mcg by mouth daily. 04/18/21  Yes [provider]  LORazepam (ATIVAN) 1 MG tablet Take 1 mg by mouth at bedtime as needed (for agitation).   Yes [provider]  nitroGLYCERIN (NITROSTAT) 0.4 MG SL tablet Place 0.4 mg under the tongue every 5 (five) minutes as needed for chest pain.   Yes [provider]  QUEtiapine (SEROQUEL) 25 MG tablet Take 25 mg by mouth at bedtime.   Yes [provider]  risperiDONE (RISPERDAL) 0.5 MG tablet Take 0.5 mg by mouth 2 (two) times daily. 07/06/22  Yes [provider]  TRESIBA FLEXTOUCH 100 UNIT/ML FlexTouch Pen Inject 5-6 Units into the skin every evening.   Yes [provider]   Allergies  Allergen Reactions   Lisinopril Cough   Onion Other (See Comments)    "Put her in the hospital, make her sick as a dog" per husband at bedside.   Codeine Diarrhea   Morphine And Related Other (See Comments)    GI UPSET   Statins Other (See Comments)    ARTHRALGIA    Review of Systems  Unable to perform ROS: Dementia    Physical Exam Vitals and nursing note reviewed.  Constitutional:      General: She is not in acute distress.    Comments: Frail appearing  Pulmonary:     Effort: No respiratory distress.  Skin:    General: Skin is warm and dry.  Neurological:     Mental Status: She is alert and oriented to person, place, and time.  Psychiatric:        Cognition and Memory: Cognition is impaired. Memory is impaired.     Vital Signs: BP (!) 145/59 (BP Location: Left Arm)   Pulse 72   Temp 97.6 F (36.4 C) (Oral)   Resp 16   LMP  (LMP Unknown)   SpO2 100%  Pain Scale: PAINAD   Pain Score: 0-No pain   SpO2: SpO2: 100 % O2 Device:SpO2: 100 % O2 Flow Rate: .   IO: Intake/output summary:  Intake/Output Summary (Last 24 hours) at  07/10/2022 1237 Last data filed at 07/10/2022 0900 Gross per 24 hour  Intake 852.5 ml  Output 2500 ml  Net -1647.5 ml    LBM: Last BM Date :  (unknown) Baseline Weight:   Most recent weight:  Palliative Assessment/Data: PPS 40-50%     Time In: 1235 Time Out: 1405 Time Total: 90 minutes  Greater than 50%  of this time was spent counseling and coordinating care related to the above assessment and plan.  Signed by: Lin Landsman, NP   Please contact Palliative Medicine Team phone at [phone] for questions and concerns.  For individual provider: See Amion  *Portions of this note are a verbal dictation therefore any spelling and/or grammatical errors are due to the "[employer]" system interpretation.
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
## 2022-07-10 NOTE — Progress Notes (Signed)
Patient's husband was very upset regarding the restraints and insisted to be removed.  He asked the nurse to give the evening medications early to avoid situation happened last night.  He has been taking care of this patient and knows what to do.  Removed the restraints and checked the skin, no new issues noted.  Patient is able to move all her extremities.  Will continue to monitor.

## 2022-07-10 NOTE — Plan of Care (Signed)
  Problem: Education: Goal: Ability to describe self-care measures that may prevent or decrease complications (Diabetes Survival Skills Education) will improve Outcome: Progressing   Problem: Coping: Goal: Ability to adjust to condition or change in health will improve Outcome: Progressing   Problem: Fluid Volume: Goal: Ability to maintain a balanced intake and output will improve Outcome: Progressing   Problem: Health Behavior/Discharge Planning: Goal: Ability to identify and utilize available resources and services will improve Outcome: Progressing Goal: Ability to manage health-related needs will improve Outcome: Progressing   Problem: Metabolic: Goal: Ability to maintain appropriate glucose levels will improve Outcome: Progressing   Problem: Nutritional: Goal: Maintenance of adequate nutrition will improve Outcome: Progressing Goal: Progress toward achieving an optimal weight will improve Outcome: Progressing   Problem: Nutritional: Goal: Progress toward achieving an optimal weight will improve Outcome: Progressing   Problem: Skin Integrity: Goal: Risk for impaired skin integrity will decrease Outcome: Progressing   Problem: Tissue Perfusion: Goal: Adequacy of tissue perfusion will improve Outcome: Progressing
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
## 2022-07-10 NOTE — TOC Progression Note (Signed)
Transition of Care [facility]) - Progression Note    Patient Details  Name: Mehak Roskelley MRN: [id number] Date of Birth: 1940-12-13  Transition of [facility]) CM/SW Contact  Tom-Johnson, Renea Ee, RN Phone Number: 07/10/2022, 3:02 PM  Clinical Narrative:     CM consulted for Home with Palliative Care. CM spoke with patient's husband, Francee Piccolo at bedside and gave him the list of Providers from [email or web address] and he chose [facility]. CM called in referral to [facility] and she will speak with patient and husband in the morning about their services as thy do not do Palliative but only Hospice Care. CM will continue to follow with needs.         Expected Discharge Plan and Services                                               Social Determinants of Health (SDOH) Interventions SDOH Screenings   Food Insecurity: No Food Insecurity (07/09/2022)  Housing: Low Risk  (07/09/2022)  Transportation Needs: No Transportation Needs (07/09/2022)  Utilities: Not At Risk (07/09/2022)  Tobacco Use: Low Risk  (07/09/2022)    Readmission Risk Interventions     No data to display

## 2022-07-10 NOTE — Plan of Care (Signed)
  Problem: Education: Goal: Ability to describe self-care measures that may prevent or decrease complications (Diabetes Survival Skills Education) will improve 07/10/2022 0231 by Dorena Cookey, LPN Outcome: Progressing 07/10/2022 0028 by Dorena Cookey, LPN Outcome: Progressing Goal: Individualized Educational Video(s) Outcome: Progressing   Problem: Coping: Goal: Ability to adjust to condition or change in health will improve 07/10/2022 0231 by Dorena Cookey, LPN Outcome: Progressing 07/10/2022 0028 by Dorena Cookey, LPN Outcome: Progressing   Problem: Fluid Volume: Goal: Ability to maintain a balanced intake and output will improve 07/10/2022 0231 by Dorena Cookey, LPN Outcome: Progressing 07/10/2022 0028 by Dorena Cookey, LPN Outcome: Progressing   Problem: Health Behavior/Discharge Planning: Goal: Ability to identify and utilize available resources and services will improve 07/10/2022 0231 by Dorena Cookey, LPN Outcome: Progressing 07/10/2022 0028 by Dorena Cookey, LPN Outcome: Progressing Goal: Ability to manage health-related needs will improve 07/10/2022 0231 by Dorena Cookey, LPN Outcome: Progressing 07/10/2022 0028 by Dorena Cookey, LPN Outcome: Progressing   Problem: Metabolic: Goal: Ability to maintain appropriate glucose levels will improve 07/10/2022 0231 by Dorena Cookey, LPN Outcome: Progressing 07/10/2022 0028 by Dorena Cookey, LPN Outcome: Progressing   Problem: Nutritional: Goal: Maintenance of adequate nutrition will improve 07/10/2022 0231 by Dorena Cookey, LPN Outcome: Progressing 07/10/2022 0028 by Dorena Cookey, LPN Outcome: Progressing Goal: Progress toward achieving an optimal weight will improve 07/10/2022 0231 by Dorena Cookey, LPN Outcome: Progressing 07/10/2022 0028 by Dorena Cookey, LPN Outcome: Progressing   Problem: Skin Integrity: Goal: Risk for impaired skin integrity will decrease 07/10/2022 0231  by Dorena Cookey, LPN Outcome: Progressing 07/10/2022 0028 by Dorena Cookey, LPN Outcome: Progressing   Problem: Tissue Perfusion: Goal: Adequacy of tissue perfusion will improve 07/10/2022 0231 by Dorena Cookey, LPN Outcome: Progressing 07/10/2022 0028 by Dorena Cookey, LPN Outcome: Progressing

## 2022-07-11 ENCOUNTER — Other Ambulatory Visit (HOSPITAL_COMMUNITY): Payer: Self-pay

## 2022-07-11 LAB — BASIC METABOLIC PANEL
Anion gap: 13 (ref 5–15)
BUN: 34 mg/dL — ABNORMAL HIGH (ref 8–23)
CO2: 19 mmol/L — ABNORMAL LOW (ref 22–32)
Calcium: 7.9 mg/dL — ABNORMAL LOW (ref 8.9–10.3)
Chloride: 109 mmol/L (ref 98–111)
Creatinine, Ser: 2.17 mg/dL — ABNORMAL HIGH (ref 0.44–1.00)
GFR, Estimated: 22 mL/min — ABNORMAL LOW (ref >60)
Glucose, Bld: 109 mg/dL — ABNORMAL HIGH (ref 70–99)
Potassium: 3.7 mmol/L (ref 3.5–5.1)
Sodium: 141 mmol/L (ref 135–145)

## 2022-07-11 LAB — GLUCOSE, CAPILLARY
Glucose-Capillary: 114 mg/dL — ABNORMAL HIGH (ref 70–99)
Glucose-Capillary: 110 mg/dL — ABNORMAL HIGH (ref 70–99)

## 2022-07-11 LAB — URINE CULTURE: Culture: >=100000 — AB

## 2022-07-11 MED ORDER — CEPHALEXIN 500 MG PO CAPS
500.0000 mg | ORAL_CAPSULE | Freq: Two times a day (BID) | ORAL | 0 refills | Status: AC
  Filled 2022-07-11: qty 10, 5d supply, fill #0

## 2022-07-11 MED ORDER — QUETIAPINE FUMARATE 25 MG PO TABS
50.0000 mg | ORAL_TABLET | Freq: Every day | ORAL | 0 refills | Status: DC
  Filled 2022-07-11: qty 30, 15d supply, fill #0

## 2022-07-11 NOTE — Progress Notes (Signed)
Mobility Specialist Progress Note   07/11/22 1143  Mobility  Activity Transferred to/from [facility];Transferred from bed to chair;Stood at bedside  Level of Assistance Moderate assist, patient does 50-74%  Assistive Device Other (Comment) (HHA)  Distance Ambulated (ft) 4 ft  Activity Response Tolerated well  Mobility Referral Yes  $Mobility charge 1 Mobility   Received pt in bed having general discomfort throughout body but agreeable to mobility. MinA to EOB but ModA to stand d/t general weakness and expressed Bilat knee pain. Stand, pivot to chair w/ MinA to manually help w/ foot placement. Once in chair, pt had BM and was able to stand, pivot to [facility]. Successful BM. Able to stand for ~ 20mns for pericare and pivoted back to chair w/o fault, pt prematurely sitting down in chair d/t fatigue. Left w/ call bell in reach and all needs met.    JHolland FallingMobility Specialist Please contact via SecureChat or  Rehab office at [phone]
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
## 2022-07-11 NOTE — Progress Notes (Signed)
Melissa Jackson to be discharged Home per MD order. Discussed prescriptions and follow up appointments with the patient. Prescriptions and medication list explained in detail. Patient verbalized understanding.  Skin clean, dry and intact without evidence of skin break down, no evidence of skin tears noted. IV catheter discontinued intact. Site without signs and symptoms of complications. Dressing and pressure applied. Pt denies pain at the site currently. No complaints noted.  Patient free of lines, drains, and wounds.   An After Visit Summary (AVS) was printed and given to the patient. Patient escorted via wheelchair, and discharged home via private auto.  Amaryllis Dyke, RN

## 2022-07-11 NOTE — TOC Transition Note (Addendum)
Transition of Care [facility]) - CM/SW Discharge Note   Patient Details  Name: Melissa Jackson MRN: [id number] Date of Birth: 1941-01-13  Transition of Care [facility] CM/SW Contact:  Tom-Johnson, Renea Ee, RN Phone Number: 07/11/2022, 3:16 PM   Clinical Narrative:     Patient is scheduled for discharge today. Anderson Malta with [facility] came to speak with patient and husband at bedside. Husband informed CM that he declined Palliative services at home. States he takes care of patient's Dementia at home and will continue to. No PT/OT f/u noted. Denies any other needs. Husband to transport at discharge.  No further TOC needs noted.        Final next level of care: Home/Self Care Barriers to Discharge: Barriers Resolved   Patient Goals and CMS Choice CMS Medicare.gov Compare Post Acute Care list provided to:: Patient Choice offered to / list presented to : Patient, Spouse  Discharge Placement                  Patient to be transferred to facility by: Husband      Discharge Plan and Services Additional resources added to the After Visit Summary for                  DME Arranged: N/A DME Agency: NA       HH Arranged: NA HH Agency: NA        Social Determinants of Health (SDOH) Interventions SDOH Screenings   Food Insecurity: No Food Insecurity (07/09/2022)  Housing: Low Risk  (07/09/2022)  Transportation Needs: No Transportation Needs (07/09/2022)  Utilities: Not At Risk (07/09/2022)  Tobacco Use: Low Risk  (07/09/2022)     Readmission Risk Interventions     No data to display
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
## 2022-07-13 DIAGNOSIS — M79644 Pain in right finger(s): Secondary | ICD-10-CM | POA: Diagnosis not present

## 2022-07-13 DIAGNOSIS — M25531 Pain in right wrist: Secondary | ICD-10-CM | POA: Diagnosis not present

## 2022-07-14 LAB — CULTURE, BLOOD (ROUTINE X 2)
Culture: NO GROWTH
Special Requests: ADEQUATE

## 2022-07-19 DIAGNOSIS — F03918 Unspecified dementia, unspecified severity, with other behavioral disturbance: Secondary | ICD-10-CM | POA: Diagnosis not present

## 2022-07-19 DIAGNOSIS — N390 Urinary tract infection, site not specified: Secondary | ICD-10-CM | POA: Diagnosis not present

## 2022-07-19 DIAGNOSIS — D519 Vitamin B12 deficiency anemia, unspecified: Secondary | ICD-10-CM | POA: Diagnosis not present

## 2022-07-19 DIAGNOSIS — N184 Chronic kidney disease, stage 4 (severe): Secondary | ICD-10-CM | POA: Diagnosis not present

## 2022-08-09 DIAGNOSIS — E039 Hypothyroidism, unspecified: Secondary | ICD-10-CM | POA: Diagnosis not present

## 2022-08-09 DIAGNOSIS — Z7409 Other reduced mobility: Secondary | ICD-10-CM | POA: Diagnosis not present

## 2022-08-09 DIAGNOSIS — I480 Paroxysmal atrial fibrillation: Secondary | ICD-10-CM | POA: Diagnosis not present

## 2022-08-09 DIAGNOSIS — E1129 Type 2 diabetes mellitus with other diabetic kidney complication: Secondary | ICD-10-CM | POA: Diagnosis not present

## 2022-08-09 DIAGNOSIS — D509 Iron deficiency anemia, unspecified: Secondary | ICD-10-CM | POA: Diagnosis not present

## 2022-08-09 DIAGNOSIS — D519 Vitamin B12 deficiency anemia, unspecified: Secondary | ICD-10-CM | POA: Diagnosis not present

## 2022-08-09 DIAGNOSIS — I10 Essential (primary) hypertension: Secondary | ICD-10-CM | POA: Diagnosis not present

## 2022-08-09 DIAGNOSIS — M810 Age-related osteoporosis without current pathological fracture: Secondary | ICD-10-CM | POA: Diagnosis not present

## 2022-08-09 DIAGNOSIS — E785 Hyperlipidemia, unspecified: Secondary | ICD-10-CM | POA: Diagnosis not present

## 2022-08-09 DIAGNOSIS — F03918 Unspecified dementia, unspecified severity, with other behavioral disturbance: Secondary | ICD-10-CM | POA: Diagnosis not present

## 2022-10-17 ENCOUNTER — Ambulatory Visit: Payer: Medicare Other | Admitting: Cardiovascular Disease

## 2022-11-03 DEATH — deceased
